# Patient Record
Sex: Female | Born: 1990
Health system: Southern US, Community
[De-identification: ages and names within clinical notes are randomized; demographics above are authoritative.]

## PROBLEM LIST (undated history)

## (undated) ENCOUNTER — Inpatient Hospital Stay (HOSPITAL_COMMUNITY): Payer: Self-pay

## (undated) ENCOUNTER — Emergency Department (HOSPITAL_COMMUNITY): Discharge status: Against Medical Advice | Payer: Self-pay

## (undated) DIAGNOSIS — O009 Unspecified ectopic pregnancy without intrauterine pregnancy: Secondary | ICD-10-CM

## (undated) DIAGNOSIS — N200 Calculus of kidney: Secondary | ICD-10-CM

## (undated) DIAGNOSIS — R519 Headache, unspecified: Secondary | ICD-10-CM

## (undated) DIAGNOSIS — D649 Anemia, unspecified: Secondary | ICD-10-CM

## (undated) DIAGNOSIS — E039 Hypothyroidism, unspecified: Secondary | ICD-10-CM

## (undated) DIAGNOSIS — R51 Headache: Secondary | ICD-10-CM

## (undated) DIAGNOSIS — N83209 Unspecified ovarian cyst, unspecified side: Secondary | ICD-10-CM

## (undated) DIAGNOSIS — B999 Unspecified infectious disease: Secondary | ICD-10-CM

## (undated) DIAGNOSIS — O9A213 Injury, poisoning and certain other consequences of external causes complicating pregnancy, third trimester: Secondary | ICD-10-CM

## (undated) HISTORY — DX: Headache: R51

## (undated) HISTORY — DX: Headache, unspecified: R51.9

## (undated) HISTORY — PX: MOUTH SURGERY: SHX715

---
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
## 2010-10-07 LAB — COMPREHENSIVE METABOLIC PANEL
Albumin: 4.3 g/dL (ref 3.5–5.2)
Alkaline Phosphatase: 85 U/L (ref 39–117)
BUN: 13 mg/dL (ref 6–23)
CO2: 27 mEq/L (ref 19–32)
Calcium: 9.5 mg/dL (ref 8.4–10.5)
Chloride: 107 mEq/L (ref 96–112)
GFR calc Af Amer: >60 mL/min (ref >60)
Glucose, Bld: 131 mg/dL — ABNORMAL HIGH (ref 70–99)
Potassium: 3.2 mEq/L — ABNORMAL LOW (ref 3.5–5.1)
Total Bilirubin: 0.6 mg/dL (ref 0.3–1.2)

## 2010-10-07 LAB — URINALYSIS, ROUTINE W REFLEX MICROSCOPIC: Protein, ur: 100 mg/dL — AB

## 2010-10-07 LAB — POCT I-STAT, CHEM 8
Calcium, Ion: 1.21 mmol/L (ref 1.12–1.32)
Glucose, Bld: 124 mg/dL — ABNORMAL HIGH (ref 70–99)
Sodium: 142 mEq/L (ref 135–145)
TCO2: 25 mmol/L (ref 0–100)

## 2010-10-07 LAB — CBC
HCT: 35 % — ABNORMAL LOW (ref 36.0–46.0)
MCV: 96.4 fL (ref 78.0–100.0)
RBC: 3.62 MIL/uL — ABNORMAL LOW (ref 3.87–5.11)
RDW: 12.1 % (ref 11.5–15.5)
WBC: 6.4 10*3/uL (ref 4.0–10.5)

## 2010-10-07 LAB — WET PREP, GENITAL: Yeast Wet Prep HPF POC: NONE SEEN

## 2010-10-07 LAB — URINE MICROSCOPIC-ADD ON

## 2010-10-07 LAB — GC/CHLAMYDIA PROBE AMP, GENITAL
Chlamydia, DNA Probe: NEGATIVE
GC Probe Amp, Genital: NEGATIVE

## 2010-10-07 LAB — DIFFERENTIAL
Basophils Absolute: 0.1 10*3/uL (ref 0.0–0.1)
Eosinophils Relative: 1 % (ref 0–5)
Lymphocytes Relative: 25 % (ref 12–46)
Lymphs Abs: 1.6 10*3/uL (ref 0.7–4.0)
Neutro Abs: 4.4 10*3/uL (ref 1.7–7.7)

## 2013-06-24 ENCOUNTER — Encounter (HOSPITAL_COMMUNITY): Payer: Self-pay | Admitting: Emergency Medicine

## 2013-06-24 ENCOUNTER — Emergency Department (HOSPITAL_COMMUNITY)
Admission: EM | Admit: 2013-06-24 | Discharge: 2013-06-24 | Discharge status: Against Medical Advice | Payer: Medicaid Other | Attending: Emergency Medicine | Admitting: Emergency Medicine

## 2013-06-24 DIAGNOSIS — R109 Unspecified abdominal pain: Secondary | ICD-10-CM | POA: Insufficient documentation

## 2013-06-24 HISTORY — DX: Calculus of kidney: N20.0

## 2013-06-24 NOTE — ED Notes (Signed)
Pt left prior to end of triage, states per family, her insurance does not cover ED visit to [facility]. Pt accompanied by family(mother), she states she is going to [facility]. Her Vital signs at this moment, are temp of 97.6, RR16, pulse of 65, B/P 99/64 O2 Sat 100%. Pain assessed at 5. Pt is exhibits no signs of distress. She complains of nausea, offered her an emesis container. Asked if pt needs anything prior to departure. Accompanied pt to the exit lobby.

## 2015-02-23 ENCOUNTER — Emergency Department (HOSPITAL_COMMUNITY): Payer: 59

## 2015-02-23 ENCOUNTER — Encounter (HOSPITAL_COMMUNITY): Payer: Self-pay

## 2015-02-23 ENCOUNTER — Emergency Department (HOSPITAL_COMMUNITY)
Admission: EM | Admit: 2015-02-23 | Discharge: 2015-02-23 | Discharge status: Against Medical Advice | Payer: 59 | Attending: Emergency Medicine | Admitting: Emergency Medicine

## 2015-02-23 DIAGNOSIS — R4789 Other speech disturbances: Secondary | ICD-10-CM

## 2015-02-23 DIAGNOSIS — Z3A16 16 weeks gestation of pregnancy: Secondary | ICD-10-CM | POA: Diagnosis not present

## 2015-02-23 DIAGNOSIS — O99341 Other mental disorders complicating pregnancy, first trimester: Secondary | ICD-10-CM | POA: Diagnosis not present

## 2015-02-23 DIAGNOSIS — Z87442 Personal history of urinary calculi: Secondary | ICD-10-CM | POA: Diagnosis not present

## 2015-02-23 DIAGNOSIS — F8089 Other developmental disorders of speech and language: Secondary | ICD-10-CM | POA: Diagnosis not present

## 2015-02-23 LAB — COMPREHENSIVE METABOLIC PANEL
ALK PHOS: 59 U/L (ref 38–126)
ALT: 16 U/L (ref 14–54)
AST: 22 U/L (ref 15–41)
Albumin: 3.7 g/dL (ref 3.5–5.0)
Anion gap: 7 (ref 5–15)
BILIRUBIN TOTAL: 0.6 mg/dL (ref 0.3–1.2)
BUN: 6 mg/dL (ref 6–20)
CALCIUM: 9.1 mg/dL (ref 8.9–10.3)
CO2: 23 mmol/L (ref 22–32)
CREATININE: 0.46 mg/dL (ref 0.44–1.00)
Chloride: 104 mmol/L (ref 101–111)
Glucose, Bld: 79 mg/dL (ref 65–99)
Potassium: 3.4 mmol/L — ABNORMAL LOW (ref 3.5–5.1)
Sodium: 134 mmol/L — ABNORMAL LOW (ref 135–145)
Total Protein: 6.8 g/dL (ref 6.5–8.1)

## 2015-02-23 LAB — URINALYSIS, ROUTINE W REFLEX MICROSCOPIC
Bilirubin Urine: NEGATIVE
GLUCOSE, UA: NEGATIVE mg/dL
HGB URINE DIPSTICK: NEGATIVE
Ketones, ur: NEGATIVE mg/dL
Leukocytes, UA: NEGATIVE
Nitrite: NEGATIVE
Protein, ur: NEGATIVE mg/dL
SPECIFIC GRAVITY, URINE: 1.021 (ref 1.005–1.030)
Urobilinogen, UA: 0.2 mg/dL (ref 0.0–1.0)
pH: 5.5 (ref 5.0–8.0)

## 2015-02-23 LAB — DIFFERENTIAL
BASOS PCT: 0 % (ref 0–1)
Basophils Absolute: 0 10*3/uL (ref 0.0–0.1)
Eosinophils Absolute: 0.1 10*3/uL (ref 0.0–0.7)
Eosinophils Relative: 1 % (ref 0–5)
LYMPHS ABS: 1.6 10*3/uL (ref 0.7–4.0)
LYMPHS PCT: 22 % (ref 12–46)
MONO ABS: 0.4 10*3/uL (ref 0.1–1.0)
MONOS PCT: 6 % (ref 3–12)
NEUTROS ABS: 5.1 10*3/uL (ref 1.7–7.7)
Neutrophils Relative %: 71 % (ref 43–77)

## 2015-02-23 LAB — I-STAT CHEM 8, ED
BUN: 7 mg/dL (ref 6–20)
CREATININE: 0.5 mg/dL (ref 0.44–1.00)
Calcium, Ion: 1.25 mmol/L — ABNORMAL HIGH (ref 1.12–1.23)
Chloride: 103 mmol/L (ref 101–111)
GLUCOSE: 77 mg/dL (ref 65–99)
HCT: 33 % — ABNORMAL LOW (ref 36.0–46.0)
HEMOGLOBIN: 11.2 g/dL — AB (ref 12.0–15.0)
POTASSIUM: 3.5 mmol/L (ref 3.5–5.1)
Sodium: 137 mmol/L (ref 135–145)
TCO2: 20 mmol/L (ref 0–100)

## 2015-02-23 LAB — RAPID URINE DRUG SCREEN, HOSP PERFORMED
AMPHETAMINES: NOT DETECTED
BENZODIAZEPINES: NOT DETECTED
Barbiturates: NOT DETECTED
Cocaine: NOT DETECTED
OPIATES: NOT DETECTED
Tetrahydrocannabinol: NOT DETECTED

## 2015-02-23 LAB — CBC
HEMATOCRIT: 31.5 % — AB (ref 36.0–46.0)
Hemoglobin: 10.9 g/dL — ABNORMAL LOW (ref 12.0–15.0)
MCH: 31.3 pg (ref 26.0–34.0)
MCHC: 34.6 g/dL (ref 30.0–36.0)
MCV: 90.5 fL (ref 78.0–100.0)
Platelets: 258 10*3/uL (ref 150–400)
RBC: 3.48 MIL/uL — ABNORMAL LOW (ref 3.87–5.11)
RDW: 12.9 % (ref 11.5–15.5)
WBC: 7.2 10*3/uL (ref 4.0–10.5)

## 2015-02-23 LAB — I-STAT TROPONIN, ED: TROPONIN I, POC: 0 ng/mL (ref 0.00–0.08)

## 2015-02-23 LAB — PROTIME-INR
INR: 1 (ref 0.00–1.49)
Prothrombin Time: 13.4 seconds (ref 11.6–15.2)

## 2015-02-23 LAB — APTT: aPTT: 28 seconds (ref 24–37)

## 2015-02-23 NOTE — ED Provider Notes (Addendum)
MSE was initiated and I personally evaluated the patient and placed orders (if any) at  2:54 PM on February 23, 2015.  The patient appears stable so that the remainder of the MSE may be completed by another provider.   I was called out to triage to evaluate this patient. Patient complaining of difficulty speaking and memory issues that started about 45 minutes ago while she was in a store. She was with her relative who had brought her great and she did not know what this food was at the time when she typically would know what a grape is. She is also having trouble remembering or placing family members that the family member was naming off. She felt confused at this time. This is progressively improved while she doesn't quite feel normal her speech is now normal. Significant other at the bedside states that her speech is slightly slow but otherwise not slurred and she is naming things correctly now. Denies any headache. Is about [redacted] weeks pregnant and has had intermittent bleeding throughout the pregnancy. No due to this she is worried about bleeding in her brain. She has no headache. On my quick neurologic exam her cranial nerves are intact, 5/5 strength in all 4 extremities, and normal speech. She is able to name a pen, wristwatch, and her significant other. I'm finding no acute deficits that would be consistent with a stroke. She may need workup but at this time is not a code stroke. Her BP is normal at this time. I spoke to Dr. Thad Ranger who notes that due to being pregnant she should probably get and MRI and MRV. The provider that sees her will need to discuss this with patient and about any possible risks MRI could pose to baby per neuro. She agrees to MRI. Will place, she will need formal full evaluation by whoever assumes her care when she gets into a room.  Pricilla Loveless, MD 02/23/15 [phone]

## 2015-02-23 NOTE — ED Provider Notes (Signed)
CSN: [id number]     Arrival date & time 02/23/15  1440 History   First MD Initiated Contact with Patient 02/23/15 1621     Chief Complaint  Patient presents with  . Neurologic Problem     (Consider location/radiation/quality/duration/timing/severity/associated sxs/prior Treatment) HPI Comments: Patient is a healthy 24 year old female who is currently [redacted] weeks pregnant. She takes no medications except for prenatal vitamin. There have been complications with this pregnancy where she had partial placenta detachment with a large amount of bleeding however in the last week or 2 she's had a repeat ultrasound and everything is now normal in fetus is measuring normal dates. Today she was at [facility] with her relative after they have gone to church and they are walking around eating samples when all of a sudden she was unable to identify what a grape was.  At first sister thought that she was kidding but then she continued to start saying she didn't know what things were she did not understand what was going on she was unable to identify a close family members or even know who her sister was talking about. During all this she did know who her husband and sister were. However they state she would read something very coherently but just kept saying I don't know what this means and I don't know what I am reading. This lasted from 2 until 3:30 and then gradually improved. It is now completely resolved. Patient has no further complaints. This is never happened before. She is not taking any hormone supplements she denies headache, fever, chest pain, shortness of breath.  Patient is a 24 y.o. female presenting with neurologic complaint. The history is provided by the patient, the spouse and a relative.  Neurologic Problem This is a new problem. Episode onset: 5 hours ago. The problem occurs constantly. The problem has been resolved. Associated symptoms comments: Confusion and unable to understand what she was reading and  could not recognize family or common objects. Nothing aggravates the symptoms. Nothing relieves the symptoms. She has tried nothing for the symptoms. The treatment provided significant relief.    Past Medical History  Diagnosis Date  . Kidney calculi    History reviewed. No pertinent past surgical history. History reviewed. No pertinent family history. Social History  Substance Use Topics  . Smoking status: Never Smoker   . Smokeless tobacco: None  . Alcohol Use: No   OB History    Gravida Para Term Preterm AB TAB SAB Ectopic Multiple Living   1              Review of Systems  All other systems reviewed and are negative.     Allergies  Review of patient's allergies indicates no known allergies.  Home Medications   Prior to Admission medications   Not on File   BP 118/76 mmHg  Pulse 78  Temp(Src) 98.3 F (36.8 C) (Oral)  Resp 16  Ht  (1.575 m)  Wt 119 lb 14.4 oz (54.386 kg)  BMI 21.92 kg/m2  SpO2 99% Physical Exam  Constitutional: She is oriented to person, place, and time. She appears well-developed and well-nourished. No distress.  HENT:  Head: Normocephalic and atraumatic.  Mouth/Throat: Oropharynx is clear and moist.  Eyes: Conjunctivae and EOM are normal. Pupils are equal, round, and reactive to light.  Neck: Normal range of motion. Neck supple.  Cardiovascular: Normal rate, regular rhythm and intact distal pulses.   No murmur heard. Pulmonary/Chest: Effort normal and breath sounds normal.  No respiratory distress. She has no wheezes. She has no rales.  Abdominal: Soft. She exhibits no distension. There is no tenderness. There is no rebound and no guarding.  Musculoskeletal: Normal range of motion. She exhibits no edema or tenderness.  Neurological: She is alert and oriented to person, place, and time. She has normal strength. No cranial nerve deficit or sensory deficit. Coordination and gait normal.  Normal speech and recall  Skin: Skin is warm and  dry. No rash noted. No erythema.  Psychiatric: She has a normal mood and affect. Her behavior is normal.  Nursing note and vitals reviewed.   ED Course  Procedures (including critical care time) Labs Review Labs Reviewed  CBC - Abnormal; Notable for the following:    RBC 3.48 (*)    Hemoglobin 10.9 (*)    HCT 31.5 (*)    All other components within normal limits  COMPREHENSIVE METABOLIC PANEL - Abnormal; Notable for the following:    Sodium 134 (*)    Potassium 3.4 (*)    All other components within normal limits  URINALYSIS, ROUTINE W REFLEX MICROSCOPIC (NOT AT [facility]) - Abnormal; Notable for the following:    APPearance CLOUDY (*)    All other components within normal limits  I-STAT CHEM 8, ED - Abnormal; Notable for the following:    Calcium, Ion 1.25 (*)    Hemoglobin 11.2 (*)    HCT 33.0 (*)    All other components within normal limits  PROTIME-INR  APTT  DIFFERENTIAL  URINE RAPID DRUG SCREEN, HOSP PERFORMED  ETHANOL  Rosezena Sensor, ED    Imaging Review Mr Brain Wo Contrast  02/23/2015   CLINICAL DATA:  Pregnant. Transient speech abnormality. Transient memory problem  EXAM: MRI HEAD WITHOUT CONTRAST  MRV HEAD WITHOUT CONTRAST  TECHNIQUE: Multiplanar, multiecho pulse sequences of the brain and surrounding structures were obtained without intravenous contrast. Angiographic images of the intracranial venous structures were obtained using MRV technique without intravenous contrast.  COMPARISON:  None.  FINDINGS: Negative for acute infarct.  Negative for chronic ischemic change  Negative for demyelinating disease. Cerebral white matter normal. Brainstem and cerebellum normal.  Ventricle size normal.  Cerebral volume normal.  Pituitary normal in size.  Cervical medullary junction normal.  Negative for hemorrhage or fluid collection. Negative for mass or edema.  Paranasal sinuses clear.  Normal orbit.  Superior sagittal sinus widely patent. Right transverse sinus dominant.  Transverse sinus and sigmoid sinus are patent bilaterally without thrombosis. Internal cerebral veins and straight sinus patent bilaterally.  IMPRESSION: Normal MRI of the brain  Normal MRV of the brain.   Electronically Signed   By: Marlan Palau M.D.   On: 02/23/2015 17:32   Mr Mrv Head Wo Cm  02/23/2015   CLINICAL DATA:  Pregnant. Transient speech abnormality. Transient memory problem  EXAM: MRI HEAD WITHOUT CONTRAST  MRV HEAD WITHOUT CONTRAST  TECHNIQUE: Multiplanar, multiecho pulse sequences of the brain and surrounding structures were obtained without intravenous contrast. Angiographic images of the intracranial venous structures were obtained using MRV technique without intravenous contrast.  COMPARISON:  None.  FINDINGS: Negative for acute infarct.  Negative for chronic ischemic change  Negative for demyelinating disease. Cerebral white matter normal. Brainstem and cerebellum normal.  Ventricle size normal.  Cerebral volume normal.  Pituitary normal in size.  Cervical medullary junction normal.  Negative for hemorrhage or fluid collection. Negative for mass or edema.  Paranasal sinuses clear.  Normal orbit.  Superior sagittal sinus widely patent. Right  transverse sinus dominant. Transverse sinus and sigmoid sinus are patent bilaterally without thrombosis. Internal cerebral veins and straight sinus patent bilaterally.  IMPRESSION: Normal MRI of the brain  Normal MRV of the brain.   Electronically Signed   By: Marlan Palau M.D.   On: 02/23/2015 17:32   I have personally reviewed and evaluated these images and lab results as part of my medical decision-making.   EKG Interpretation   Date/Time:  Sunday February 23 2015 15:15:56 EDT Ventricular Rate:  78 PR Interval:  144 QRS Duration: 72 QT Interval:  366 QTC Calculation: 417 R Axis:   81 Text Interpretation:  Normal sinus rhythm Normal ECG No old tracing to  compare Confirmed by GOLDSTON  MD, SCOTT ([id number]) on 02/23/2015 3:28:18 PM      MDM    Final diagnoses:  Word finding difficulty    Patient coming in with word finding difficulty that started approximately 2:00 today of resolved by 4. She has a history of being [redacted] weeks pregnant with complications in the pregnancy early on that now resolved. Currently she is only taking a prenatal vitamin and husband denies any prior exam general this estrogen use or fertility medications. Sister denied her complaining of a headache but states right before the episode she did feel slightly lightheaded. Husband states that she has had issues with hypoglycemia during this pregnancy if she does not eat and she had only had half an English muffin today. However all of her symptoms resolved without eating.  Dr. Gwenlyn Fudge saw the patient in triage and spoke with Dr. Thad Ranger who recommended that she get an MRI and MR be because she was pregnant to rule out owing pathology. Patient is currently in radiology when I went to see her.  5:47 PM All imaging and labs are within normal limits. Discussed with neurology and feel pt is safe for d/c.  Could've been that this was a brief hypoglycemic event she did not eat and felt dizzy prior to the episode versus some other unexplained event. Patient come follow-up with her OB/GYN. This was discussed with the patient and her family.  Gwyneth Sprout, MD 02/23/15 1840

## 2015-02-23 NOTE — ED Notes (Signed)
Patient returned from MRI.

## 2015-02-23 NOTE — ED Notes (Signed)
[redacted] weeks pregnant.  G2P0,,, complications with this pregnancy, partially detached placenta @ 9 weeks with large amount of bleeding initially, mild to spotting until 14 weeks.  LOV 02-20-15, placenta completely attached, u/s done- normal.  No bleeding since stopped at 14 weeks.  Onset today pt was with sister @ [employer], pt had brief episode of not being able to process things the the names of foods, relatives.  Pt is responding a little slower than usual to questions per husband, pt reports she has to think about what she is saying.  No facial drooping, weakness or sensation deficits.  Dr.Goldston at triage assessing pt.

## 2015-02-23 NOTE — Discharge Instructions (Signed)
Aphasia Aphasia is a neurological disorder caused by damage to the parts of the brain that control language. CAUSES  Aphasia is not a disease, but a symptom of brain damage. Aphasia is commonly seen in adults who have suffered a stroke. Aphasia also can result from:  A brain tumor.  Infection.  Head injury.  A rare type of dementia called Primary Progressive Aphasia. Common types of dementia may be associated with aphasia but can also exist without language problems. SYMPTOMS  Primary signs of the disorder include:  Problems expressing oneself when speaking.  Trouble understanding speech.  Difficulty with reading and writing.  Speaking in short or incomplete sentences.  Speaking in sentences that don't make sense.  Speaking unrecognizable words.  Interpreting figurative language literally.  Writing sentences that don't make sense. The type and severity of language problems depend on the precise location and extent of the damaged brain tissue. Aphasia can be divided into four broad categories:  Expressive aphasia - difficulty in conveying thoughts through speech or writing. The patient knows what they want to say, but cannot find the words they need.  Receptive aphasia - difficulty understanding spoken or written language. The patient hears the voice or sees the print but cannot make sense of the words.  Anomic or amnesia aphasia - difficulty in using the correct names for particular objects, people, places, or events. This is the least severe form of aphasia.  Global aphasia results from severe and extensive damage to the language areas of the brain. Patients lose almost all language function, both comprehension (understanding) and expression. They cannot speak, understand speech, read, or write. TREATMENT  Sometimes an individual will completely recover from aphasia without treatment. In most cases, language therapy should begin as soon as possible. Language therapy should  be tailored to the individual needs of the patient. Therapy with a speech pathologist involves exercises in which patients:  Read.  Write.  Follow directions.  Repeat what they hear.  Computer-aided therapy may also be used. PROGNOSIS  The outcome of aphasia is difficult to predict. People who are younger or have less extensive brain damage do better. The location of the injury is also important. The location is a clue to prognosis. In general, patients tend to recover skills in language comprehension (understanding) more completely than those skills involving expression (speaking or writing). Document Released: 03/13/2002 Document Revised: 09/13/2011 Document Reviewed: 09/10/2013 [employer] Patient Information 2015 [employer]. This information is not intended to replace advice given to you by your health care provider. Make sure you discuss any questions you have with your health care provider.  

## 2015-05-28 ENCOUNTER — Encounter (HOSPITAL_COMMUNITY): Payer: Self-pay

## 2015-05-28 ENCOUNTER — Inpatient Hospital Stay (HOSPITAL_COMMUNITY): Payer: 59

## 2015-05-28 ENCOUNTER — Inpatient Hospital Stay (HOSPITAL_COMMUNITY)
Admission: AD | Admit: 2015-05-28 | Discharge: 2015-05-28 | Disposition: A | Discharge status: Home / Self Care | Payer: 59 | Source: Ambulatory Visit | Attending: Obstetrics and Gynecology | Admitting: Obstetrics and Gynecology

## 2015-05-28 DIAGNOSIS — O43123 Velamentous insertion of umbilical cord, third trimester: Secondary | ICD-10-CM | POA: Insufficient documentation

## 2015-05-28 DIAGNOSIS — O9A212 Injury, poisoning and certain other consequences of external causes complicating pregnancy, second trimester: Secondary | ICD-10-CM

## 2015-05-28 DIAGNOSIS — R1032 Left lower quadrant pain: Secondary | ICD-10-CM | POA: Diagnosis not present

## 2015-05-28 DIAGNOSIS — W010XXA Fall on same level from slipping, tripping and stumbling without subsequent striking against object, initial encounter: Secondary | ICD-10-CM | POA: Insufficient documentation

## 2015-05-28 DIAGNOSIS — Z3A29 29 weeks gestation of pregnancy: Secondary | ICD-10-CM

## 2015-05-28 DIAGNOSIS — O26893 Other specified pregnancy related conditions, third trimester: Secondary | ICD-10-CM | POA: Diagnosis not present

## 2015-05-28 DIAGNOSIS — O26899 Other specified pregnancy related conditions, unspecified trimester: Secondary | ICD-10-CM

## 2015-05-28 DIAGNOSIS — O9A213 Injury, poisoning and certain other consequences of external causes complicating pregnancy, third trimester: Secondary | ICD-10-CM

## 2015-05-28 DIAGNOSIS — R109 Unspecified abdominal pain: Secondary | ICD-10-CM

## 2015-05-28 LAB — KLEIHAUER-BETKE STAIN
# Vials RhIg: 1
FETAL CELLS %: 0 %
QUANTITATION FETAL HEMOGLOBIN: 0 mL

## 2015-05-28 MED ORDER — ACETAMINOPHEN 500 MG PO TABS: 1000.0000 mg | ORAL_TABLET | Freq: Once | ORAL | Status: DC

## 2015-05-28 NOTE — Discharge Instructions (Signed)
What Do I Need to Know About Injuries During Pregnancy? Trauma is the most common cause of injury and death in pregnant women. This can also result in significant harm or death of the baby. Your baby is protected in the womb (uterus) by a sac filled with fluid (amniotic sac). Your baby can be harmed if there is direct, high-impact trauma to your abdomen and pelvis. This type of trauma can result in tearing of your uterus, the placenta pulling away from the wall of the uterus (placenta abruption), or the amniotic sac breaking open (rupture of membranes). These injuries can decrease or stop the blood supply to your baby or cause you to go into labor earlier than expected. Minor falls and low-impact automobile accidents do not usually harm your baby, even if they do minimally harm you. WHAT KIND OF INJURIES CAN AFFECT MY PREGNANCY? The most common causes of injury or death to a baby include:  Falls. Falls are more common in the second and third trimester of the pregnancy. Factors that increase your risk of falling include:  Increase in your weight.  The change in your center of gravity.  Tripping over an object that cannot be seen.  Increased looseness (laxity) of your ligaments resulting in less coordinated movements (you may feel clumsy).  Falling during high-risk activities like horseback riding or skiing.  Automobile accidents. It is important to wear your seat belt properly, with the lap belt below your abdomen, and always practice safe driving.  Domestic violence or assault.  Burns (Metallurgistfire or electrical). The most common causes of injury or death to the pregnant woman include:  Injuries that cause severe bleeding, shock, and loss of blood flow to major organs.  Head and neck injuries that result in severe brain or spinal damage.  Chest trauma that can cause direct injury to the heart and lungs or any injury that affects the area enclosed by the ribs. Trauma to this area can result in  cardiorespiratory arrest. WHAT CAN I DO TO PROTECT MYSELF AND MY BABY FROM INJURY WHILE I AM PREGNANT?  Remove slippery rugs and loose objects on the floor that increase your risk of tripping.  Avoid walking on wet or slippery floors.  Wear comfortable shoes that have a good grip on the sole. Do not wear high-heeled shoes.  Always wear your seat belt properly, with the lap belt below your abdomen, and always practice safe driving. Do not ride on a motorcycle while pregnant.  Do not participate in high-impact activities or sports.  Avoid fires, starting fires, lifting heavy pots of boiling or hot liquids, and fixing electrical problems.  Only take over-the-counter or prescription medicines for pain, fever, or discomfort as directed by your health care provider.  Know your blood type and the father's blood type in case you develop vaginal bleeding or experience an injury for which a blood transfusion may be necessary.  Call your local emergency services (911 in the [place] if you are a victim of domestic violence or assault. Spousal abuse can be a significant cause of trauma during pregnancy. For help and support, contact the [employer] Domestic Violence Hotline. WHEN SHOULD I SEEK IMMEDIATE MEDICAL CARE?   You fall on your abdomen or experience any high-force accident or injury.  You have been assaulted (domestic or otherwise).  You have been in a car accident.  You develop vaginal bleeding.  You develop fluid leaking from the vagina.  You develop uterine contractions (pelvic cramping, pain, or significant low back  pain).  You become weak or faint, or have uncontrolled vomiting after trauma.  You had a serious burn. This includes burns to the face, neck, hands, or genitals, or burns greater than the size of your palm anywhere else.  You develop neck stiffness or pain after a fall or from other trauma.  You develop a headache or vision problems after a fall or from other  trauma.  You do not feel the baby moving or the baby is not moving as much as before a fall or other trauma.   This information is not intended to replace advice given to you by your health care provider. Make sure you discuss any questions you have with your health care provider.   Document Released: 07/29/2004 Document Revised: 07/12/2014 Document Reviewed: 03/28/2013 Elsevier Interactive Patient Education 2016 [employer]. Placental Abruption for INFORMATION ONLY Your placenta is the organ that nourishes your unborn baby (fetus). Your baby gets his or her blood supply and nutrients through your placenta. It is your baby's life support system. It is attached to the inside of your uterus until after your baby is born.  Placental abruption is when the placenta partly or completely separates from the uterus before your baby is born. This is rare, but it can happen any time after 20 weeks of pregnancy. A small separation may not cause problems, but a large separation may be dangerous for you and your baby. CAUSES  Most of the time the cause of a placental abruption is unknown. Though it is rare, a placental abruption can be caused by:   An abdominal injury.   The baby turning from a buttocks-first position (breech presentation) or a sideways position (transverse) to a headfirst position (cephalic).   Delivering the first of multiple babies (twins, triplets, or more).   Sudden loss of amniotic fluid (premature rupture of the membranes).   An abnormally short umbilical cord. RISK FACTORS Some risk factors make a placental abruption more likely, including:  History of placental abruption.  High blood pressure (hypertension).  Smoking.  Alcohol intake.  Blood clotting problems.  Too much amniotic fluid.  Having had multiples (twins or triplets or more).  Seizures and convulsions.  Diabetes mellitus.  Having had more than four children.  Age 36 years or older.  Illegal  drug use.  Injury to your abdomen. SIGNS AND SYMPTOMS  A small placental abruption may not cause symptoms. If you do have symptoms, they may include:  Mild abdominal pain.  Slight vaginal bleeding. Symptoms of severe placental abruption depend on the size of the separation and the stage of pregnancy. Symptoms may include:   Sudden pain in your uterus.  Abdominal pain.  Vaginal bleeding.  Tender uterus.  Severe abdominal pain with tenderness.  Continual contractions of your uterus.  Back pain.  Weakness, light-headedness. DIAGNOSIS  Placental abruption is suspected when a pregnant woman develops sudden pain in her uterus. The health care provider will check whether the uterus is very tender, hard, and enlarging and whether the baby has an abnormal heart rate or rhythm. Ultrasonography (commonly called an ultrasound) will be done. Blood work will also be done to make sure that there are enough healthy red blood cells and that there are no clotting problems or signs of too much blood loss. TREATMENT  Placental abruption is usually an emergency. It requires treatment right away. Your treatment will depend on:   The amount of bleeding.  Whether you or you baby are in distress.  The  stage of your pregnancy.  The maturity of the baby. Treatment for partial separation of the placenta is bed rest and close observation. You also may need a blood transfusion or to receive fluids through an IV tube. Treatment for complete placental separation is delivery of your baby. You may have a cesarean delivery if your baby is in distress. HOME CARE INSTRUCTIONS   Only take medicines as directed by your health care provider.  Arrange for help at home before and after you deliver the baby, especially if you had a cesarean delivery or lost a lot of blood.  Get plenty of rest and sleep.  Do not have sexual intercourse until your health care provider says it is okay.  Do not use tampons or  douche unless your health care provider says it is okay. SEEK MEDICAL CARE IF:  You have light vaginal bleeding or spotting.  You have any type of trauma, such as a fall or jolt during an accident.  You are having trouble avoiding drugs, alcohol, or smoking. SEEK IMMEDIATE MEDICAL CARE IF:  You have vaginal bleeding.  You have abdominal pain.  You have continuous uterine contractions.  You have a hard, tender uterus.  You do not feel the baby move, or the baby moves very little. MAKE SURE YOU:  Understand these instructions.  Will watch your condition.  Will get help right away if you are not doing well or get worse.   This information is not intended to replace advice given to you by your health care provider. Make sure you discuss any questions you have with your health care provider.   Document Released: 06/21/2005 Document Revised: 06/26/2013 Document Reviewed: 04/13/2013 Elsevier Interactive Patient Education 2016 [employer]. Preterm Labor Information INFORMATION ONLY Preterm labor is when labor starts at less than 37 weeks of pregnancy. The normal length of a pregnancy is 39 to 41 weeks. CAUSES Often, there is no identifiable underlying cause as to why a woman goes into preterm labor. One of the most common known causes of preterm labor is infection. Infections of the uterus, cervix, vagina, amniotic sac, bladder, kidney, or even the lungs (pneumonia) can cause labor to start. Other suspected causes of preterm labor include:   Urogenital infections, such as yeast infections and bacterial vaginosis.   Uterine abnormalities (uterine shape, uterine septum, fibroids, or bleeding from the placenta).   A cervix that has been operated on (it may fail to stay closed).   Malformations in the fetus.   Multiple gestations (twins, triplets, and so on).   Breakage of the amniotic sac.  RISK FACTORS  Having a previous history of preterm labor.   Having premature  rupture of membranes (PROM).   Having a placenta that covers the opening of the cervix (placenta previa).   Having a placenta that separates from the uterus (placental abruption).   Having a cervix that is too weak to hold the fetus in the uterus (incompetent cervix).   Having too much fluid in the amniotic sac (polyhydramnios).   Taking illegal drugs or smoking while pregnant.   Not gaining enough weight while pregnant.   Being younger than 62 and older than 24 years old.   Having a low socioeconomic status.   Being African American. SYMPTOMS Signs and symptoms of preterm labor include:   Menstrual-like cramps, abdominal pain, or back pain.  Uterine contractions that are regular, as frequent as six in an hour, regardless of their intensity (may be mild or painful).  Contractions  that start on the top of the uterus and spread down to the lower abdomen and back.   A sense of increased pelvic pressure.   A watery or bloody mucus discharge that comes from the vagina.  TREATMENT Depending on the length of the pregnancy and other circumstances, your health care provider may suggest bed rest. If necessary, there are medicines that can be given to stop contractions and to mature the fetal lungs. If labor happens before 34 weeks of pregnancy, a prolonged hospital stay may be recommended. Treatment depends on the condition of both you and the fetus.  WHAT SHOULD YOU DO IF YOU THINK YOU ARE IN PRETERM LABOR? Call your health care provider right away. You will need to go to the hospital to get checked immediately. HOW CAN YOU PREVENT PRETERM LABOR IN FUTURE PREGNANCIES? You should:   Stop smoking if you smoke.  Maintain healthy weight gain and avoid chemicals and drugs that are not necessary.  Be watchful for any type of infection.  Inform your health care provider if you have a known history of preterm labor.   This information is not intended to replace advice given  to you by your health care provider. Make sure you discuss any questions you have with your health care provider.   Document Released: 09/11/2003 Document Revised: 02/21/2013 Document Reviewed: 07/24/2012 Elsevier Interactive Patient Education [employer]2016 Elsevier Inc.

## 2015-05-28 NOTE — MAU Provider Note (Signed)
History     CSN: [id number]  Arrival date and time: 05/28/15 1530 Provider notified: 1600 Orders placed in EPIC: 1627 Provider on unit: 1730 Provider at bedside: 1735     Chief Complaint  Patient presents with  . Fall   HPI Comments: [name] is [age] yo G2P0010 at 29.5 wks by LMP, presenting for s/p fall.  She states she was walking in the parking lot outside of her job and slipped (d/t poor tread on her shoes) hitting her LT hip.  She denies hitting her abdomen or her head during her fall. She reports having a little LLQ pain just after the fall, but now the pain is only concentrated in her LT groin area and LT hip.  She denies any bleeding or leaking of fluid. (+) FM. Her blood type is: A+ (per prenatal records from previous practice).  Her prenatal care has been complicated by: marginal cord insertion.  Her primary OB provider at [facility] is Alison Turner, CNM.  Fall The accident occurred 6 to 12 hours ago. The fall occurred while walking. She fell from an unknown height. She landed on concrete. There was no blood loss. The point of impact was the left hip. The pain is present in the left hip. The pain is mild. The symptoms are aggravated by ambulation. Associated symptoms include abdominal pain. Associated symptoms comments: LLQ pain. She has tried nothing for the symptoms. The treatment provided no relief.      Past Medical History  Diagnosis Date  . Kidney calculi     History reviewed. No pertinent past surgical history.  History reviewed. No pertinent family history.  Social History  Substance Use Topics  . Smoking status: Never Smoker   . Smokeless tobacco: None  . Alcohol Use: No    Allergies: No Known Allergies  Prescriptions prior to admission  Medication Sig Dispense Refill Last Dose  . Prenatal Vit-Fe Fumarate-FA (PRENATAL MULTIVITAMIN) TABS tablet Take 1 tablet by mouth daily at 12 noon.   Past Month at Unknown time    Review of Systems   Constitutional: Negative.   HENT: Negative.   Eyes: Negative.   Cardiovascular: Negative.   Gastrointestinal: Positive for abdominal pain.  Genitourinary: Negative.   Musculoskeletal: Positive for joint pain.       LT groin & hip soreness  Skin: Negative.   Neurological: Negative.   Endo/Heme/Allergies: Negative.   Psychiatric/Behavioral: Negative.    CEFM  FHR: 130 bpm / moderate variability / accels present / decels absent TOCO: No UC's, occ UI in earlier tracing  OB Limited U/S Posterior placenta with no abruption seen, AFI WNL, cephalic presentation, CL 3.45 cm  Physical Exam   Blood pressure 99/60, pulse 93, temperature 98.7 F (37.1 C), temperature source Oral, resp. rate 18, height 5\' 3"  (1.6 m), weight 62.415 kg (137 lb 9.6 oz).  Physical Exam  Constitutional: She is oriented to person, place, and time. She appears well-developed and well-nourished.  HENT:  Head: Normocephalic and atraumatic.  Eyes: Pupils are equal, round, and reactive to light.  Neck: Normal range of motion.  Cardiovascular: Normal rate, regular rhythm, normal heart sounds and intact distal pulses.   Respiratory: Effort normal and breath sounds normal.  GI: Soft. Bowel sounds are normal.  Genitourinary:  Pelvic deferred  Musculoskeletal: Normal range of motion.  Neurological: She is alert and oriented to person, place, and time. She has normal reflexes.  Skin: Skin is warm and dry.  Psychiatric: She has a normal  mood and affect. Her behavior is normal. Judgment and thought content normal.    MAU Course  Procedures Prolonged CEFM x 4 hrs OB Limited U/S KLB Tylenol 1000 mg x 1 dose prn pain Assessment and Plan  G2P0010 with 29.[redacted] wks gestation SIUP S/P Traumatic Injury during pregnancy, third trimester Marginal Cord Insertion Category 1 FHR tracing  Discharge Home after 4hrs of monitoring Bleeding and PTL precautions discussed / things to watch for UC's every 5 mins and/or stronger,  leaking or gushing of vaginal fluid, BRB like a period, decreased or no FM Keep scheduled appointment with [facility] Call the office for any of the above s/s or worsening pain  *Dr. Billy Coast notified of assessment and plan - agrees  Kenard Gower MSN, CNM 05/28/2015, 5:41 PM

## 2015-05-28 NOTE — MAU Note (Signed)
Pt reports she fell on her left side today. C/o feeling like she  strained something on that side. Good fetal movement reported no vag bleeding noted.

## 2015-05-28 NOTE — Progress Notes (Signed)
Returned call and put in orders for u/s and fetal monitoring. Will come see patient

## 2015-05-28 NOTE — Progress Notes (Signed)
Message left regarding pt in MAU and requesting call back. 

## 2015-06-02 ENCOUNTER — Inpatient Hospital Stay (HOSPITAL_COMMUNITY)
Admission: AD | Admit: 2015-06-02 | Discharge: 2015-06-02 | Disposition: A | Discharge status: Home / Self Care | Payer: 59 | Source: Ambulatory Visit | Attending: Obstetrics | Admitting: Obstetrics

## 2015-06-02 ENCOUNTER — Encounter (HOSPITAL_COMMUNITY): Payer: Self-pay

## 2015-06-02 DIAGNOSIS — Z3689 Encounter for other specified antenatal screening: Secondary | ICD-10-CM

## 2015-06-02 DIAGNOSIS — D509 Iron deficiency anemia, unspecified: Secondary | ICD-10-CM

## 2015-06-02 DIAGNOSIS — Z3A30 30 weeks gestation of pregnancy: Secondary | ICD-10-CM | POA: Insufficient documentation

## 2015-06-02 DIAGNOSIS — E860 Dehydration: Secondary | ICD-10-CM

## 2015-06-02 DIAGNOSIS — O99013 Anemia complicating pregnancy, third trimester: Secondary | ICD-10-CM

## 2015-06-02 DIAGNOSIS — O4703 False labor before 37 completed weeks of gestation, third trimester: Secondary | ICD-10-CM

## 2015-06-02 DIAGNOSIS — O26893 Other specified pregnancy related conditions, third trimester: Secondary | ICD-10-CM | POA: Diagnosis present

## 2015-06-02 LAB — URINALYSIS, ROUTINE W REFLEX MICROSCOPIC
Bilirubin Urine: NEGATIVE
Bilirubin Urine: NEGATIVE
GLUCOSE, UA: NEGATIVE mg/dL
Glucose, UA: NEGATIVE mg/dL
KETONES UR: 40 mg/dL — AB
Ketones, ur: 40 mg/dL — AB
LEUKOCYTES UA: NEGATIVE
NITRITE: NEGATIVE
Nitrite: NEGATIVE
PH: 6 (ref 5.0–8.0)
Protein, ur: NEGATIVE mg/dL
Protein, ur: NEGATIVE mg/dL
SPECIFIC GRAVITY, URINE: 1.025 (ref 1.005–1.030)
pH: 6 (ref 5.0–8.0)

## 2015-06-02 LAB — URINE MICROSCOPIC-ADD ON

## 2015-06-02 LAB — CBC
HEMATOCRIT: 24.7 % — AB (ref 36.0–46.0)
HEMOGLOBIN: 8.6 g/dL — AB (ref 12.0–15.0)
MCH: 32.5 pg (ref 26.0–34.0)
MCHC: 34.8 g/dL (ref 30.0–36.0)
MCV: 93.2 fL (ref 78.0–100.0)
Platelets: 153 10*3/uL (ref 150–400)
RBC: 2.65 MIL/uL — ABNORMAL LOW (ref 3.87–5.11)
RDW: 13.4 % (ref 11.5–15.5)
WBC: 9.3 10*3/uL (ref 4.0–10.5)

## 2015-06-02 MED ORDER — LACTATED RINGERS IV SOLN
INTRAVENOUS | Status: DC
  Administered 2015-06-02: 14:00:00 via INTRAVENOUS

## 2015-06-02 MED ORDER — BETAMETHASONE SOD PHOS & ACET 6 (3-3) MG/ML IJ SUSP
12.0000 mg | Freq: Once | INTRAMUSCULAR | Status: AC
  Administered 2015-06-02: 12 mg via INTRAMUSCULAR
  Filled 2015-06-02 (×2): qty 2

## 2015-06-02 MED ORDER — ACETAMINOPHEN 500 MG PO TABS
1000.0000 mg | ORAL_TABLET | Freq: Once | ORAL | Status: AC
  Administered 2015-06-02: 1000 mg via ORAL
  Filled 2015-06-02: qty 2

## 2015-06-02 MED ORDER — ONDANSETRON HCL 4 MG/2ML IJ SOLN
4.0000 mg | Freq: Once | INTRAMUSCULAR | Status: AC
  Administered 2015-06-02: 4 mg via INTRAVENOUS
  Filled 2015-06-02: qty 2

## 2015-06-02 MED ORDER — LACTATED RINGERS IV BOLUS (SEPSIS)
1000.0000 mL | Freq: Once | INTRAVENOUS | Status: AC
  Administered 2015-06-02: 1000 mL via INTRAVENOUS

## 2015-06-02 MED ORDER — NIFEDIPINE 10 MG PO CAPS
10.0000 mg | ORAL_CAPSULE | Freq: Once | ORAL | Status: AC
  Administered 2015-06-02: 10 mg via ORAL
  Filled 2015-06-02: qty 1

## 2015-06-02 NOTE — MAU Provider Note (Signed)
History     CSN: [id number]  Arrival date and time: 06/02/15 1153   None     Chief Complaint  Patient presents with  . Abdominal Pain  . Back Pain   HPI Comments: G2P0010 @30 .3 weeks sent from office for c/o ctx and back pain. Reports ctx started around 2100 last night, were intense then subsided during the night but then had persistent lower back pain mostly on right side and hip. She felt hot and cold during the night and temp was 100.0 this am. Denies dysuria and hematuria but reports polyuria this am. No VB or LOF. Good FM. Also reports nausea since 10 am and has not eaten since noon yesterday. Pregnancy complicated by hx SAB, marginal CI with normal growth and AF, and recent fall about 5 days ago w/o abd contact or trauma. Remote hx of recurrent UTIs and kidney stones.   Abdominal Pain Associated symptoms include a fever, frequency, headaches and nausea. Pertinent negatives include no diarrhea, dysuria, hematuria or vomiting.  Back Pain Associated symptoms include abdominal pain, a fever and headaches. Pertinent negatives include no dysuria.    OB History    Gravida Para Term Preterm AB TAB SAB Ectopic Multiple Living   2    1  1          Past Medical History  Diagnosis Date  . Kidney calculi     Past Surgical History  Procedure Laterality Date  . Mouth surgery      No family history on file.  Social History  Substance Use Topics  . Smoking status: Never Smoker   . Smokeless tobacco: None  . Alcohol Use: No    Allergies: No Known Allergies  Prescriptions prior to admission  Medication Sig Dispense Refill Last Dose  . Prenatal Vit-Fe Fumarate-FA (PRENATAL MULTIVITAMIN) TABS tablet Take 1 tablet by mouth daily at 12 noon.   Past Month at Unknown time    Review of Systems  Constitutional: Positive for fever, chills and malaise/fatigue.  Eyes: Negative.   Respiratory: Negative.   Gastrointestinal: Positive for nausea and abdominal pain. Negative for  vomiting and diarrhea.  Genitourinary: Positive for frequency and flank pain. Negative for dysuria, urgency and hematuria.  Musculoskeletal: Positive for back pain.  Skin: Negative.   Neurological: Positive for headaches.  Endo/Heme/Allergies: Negative.   Psychiatric/Behavioral: Negative.    Physical Exam   Blood pressure 102/62, pulse 92, temperature 98.5 F (36.9 C), temperature source Oral, resp. rate 18.  Physical Exam  Constitutional: She is oriented to person, place, and time. She appears well-developed and well-nourished.  HENT:  Head: Normocephalic and atraumatic.  Neck: Normal range of motion. Neck supple.  Cardiovascular: Normal rate.   CVA neg  Respiratory: Effort normal.  GI: Soft. There is no tenderness.  RUQ tenderness SP tenderness   Genitourinary:  deferred  Musculoskeletal: Normal range of motion.  Rt hip tenderness  Neurological: She is alert and oriented to person, place, and time.  Skin: Skin is warm and dry.  Psychiatric: She has a normal mood and affect.  EFM: 145 bpm, mod variability, +accels, no decels Toco: irritability Results for Stark BrayWHEELER, Alison CONRAD (MRN [id number]) as of 06/02/2015 15:57  Ref. Range 06/02/2015 13:29 06/02/2015 14:00  WBC Latest Ref Range: 4.0-10.5 K/uL 9.3   RBC Latest Ref Range: 3.87-5.11 MIL/uL 2.65 (L)   Hemoglobin Latest Ref Range: 12.0-15.0 g/dL 8.6 (L)   HCT Latest Ref Range: 36.0-46.0 % 24.7 (L)   MCV Latest Ref Range: 78.0-100.0 fL 93.2  MCH Latest Ref Range: 26.0-34.0 pg 32.5   MCHC Latest Ref Range: 30.0-36.0 g/dL 32.4   RDW Latest Ref Range: 11.5-15.5 % 13.4   Platelets Latest Ref Range: 150-400 K/uL 153   Appearance Latest Ref Range: CLEAR   CLEAR  Bacteria, UA Latest Ref Range: NONE SEEN   FEW (A)  Bilirubin Urine Latest Ref Range: NEGATIVE   NEGATIVE  Color, Urine Latest Ref Range: YELLOW   YELLOW  Glucose Latest Ref Range: NEGATIVE mg/dL  NEGATIVE  Hgb urine dipstick Latest Ref Range: NEGATIVE   SMALL (A)   Ketones, ur Latest Ref Range: NEGATIVE mg/dL  40 (A)  Leukocytes, UA Latest Ref Range: NEGATIVE   NEGATIVE  Nitrite Latest Ref Range: NEGATIVE   NEGATIVE  pH Latest Ref Range: 5.0-8.0   6.0  Protein Latest Ref Range: NEGATIVE mg/dL  NEGATIVE  RBC / HPF Latest Ref Range: 0-5 RBC/hpf  0-5  Specific Gravity, Urine Latest Ref Range: 1.005-1.030   >1.030 (H)  Squamous Epithelial / LPF Latest Ref Range: NONE SEEN   0-5 (A)  WBC, UA Latest Ref Range: 0-5 WBC/hpf  0-5   MAU Course  Procedures Labs IVF bolus LR x1 L Zofran 4 mg IV Tylenol 1000 mg po x1 Procardia 10 mg po x1 Betamethasone 12 mg IM x1  1540-reassessment- feeling much better, back pain resolved, only occ mild cramping, tolerating po w/o problem, no emesis or nausea; SVE 0.5/40/out of pelvis (no change from office exam)  Assessment and Plan  30.[redacted] weeks gestation Preterm contractions Dehydration Reactive NST IDA of pregnancy  Discharge home PTL precautions Rest Pelvic rest Continue PNV 1 po daily Start Niferex 150 mg po bid (Rx sent) Follow-up tomorrow at [facility] for f/u and BMX #2  A/P discussed with Dr. Finis Bud, Alison Turner, Alison Turner 06/02/2015, 12:56 PM

## 2015-06-02 NOTE — MAU Note (Signed)
Pt C/O uc's since last night, also lower back pain, could not sleep.  Seen @ [facility] office, 1 cm.  Pt states she was dx'd with UTI & dehydration.  Also C/O nausea, no vomiting.  Denies bleeding or LOF.

## 2015-06-02 NOTE — Discharge Instructions (Signed)
Dehydration, Adult °Dehydration is a condition in which you do not have enough fluid or water in your body. It happens when you take in less fluid than you lose. Vital organs such as the kidneys, brain, and heart cannot function without a proper amount of fluids. Any loss of fluids from the body can cause dehydration.  °Dehydration can range from mild to severe. This condition should be treated right away to help prevent it from becoming severe. °CAUSES  °This condition may be caused by: °· Vomiting. °· Diarrhea. °· Excessive sweating, such as when exercising in hot or humid weather. °· Not drinking enough fluid during strenuous exercise or during an illness. °· Excessive urine output. °· Fever. °· Certain medicines. °RISK FACTORS °This condition is more likely to develop in: °· People who are taking certain medicines that cause the body to lose excess fluid (diuretics).   °· People who have a chronic illness, such as diabetes, that may increase urination. °· Older adults.   °· People who live at high altitudes.   °· People who participate in endurance sports.   °SYMPTOMS  °Mild Dehydration °· Thirst. °· Dry lips. °· Slightly dry mouth. °· Dry, warm skin. °Moderate Dehydration °· Very dry mouth.   °· Muscle cramps.   °· Dark urine and decreased urine production.   °· Decreased tear production.   °· Headache.   °· Light-headedness, especially when you stand up from a sitting position.   °Severe Dehydration °· Changes in skin.   °¨ Cold and clammy skin.   °¨ Skin does not spring back quickly when lightly pinched and released.   °· Changes in body fluids.   °¨ Extreme thirst.   °¨ No tears.   °¨ Not able to sweat when body temperature is high, such as in hot weather.   °¨ Minimal urine production.   °· Changes in vital signs.   °¨ Rapid, weak pulse (more than 100 beats per minute when you are sitting still).   °¨ Rapid breathing.   °¨ Low blood pressure.   °· Other changes.   °¨ Sunken eyes.   °¨ Cold hands and feet.    °¨ Confusion. °¨ Lethargy and difficulty being awakened. °¨ Fainting (syncope).   °¨ Short-term weight loss.   °¨ Unconsciousness. °DIAGNOSIS  °This condition may be diagnosed based on your symptoms. You may also have tests to determine how severe your dehydration is. These tests may include:  °· Urine tests.   °· Blood tests.   °TREATMENT  °Treatment for this condition depends on the severity. Mild or moderate dehydration can often be treated at home. Treatment should be started right away. Do not wait until dehydration becomes severe. Severe dehydration needs to be treated at the hospital. °Treatment for Mild Dehydration °· Drinking plenty of water to replace the fluid you have lost.   °· Replacing minerals in your blood (electrolytes) that you may have lost.   °Treatment for Moderate Dehydration  °· Consuming oral rehydration solution (ORS). °Treatment for Severe Dehydration °· Receiving fluid through an IV tube.   °· Receiving electrolyte solution through a feeding tube that is passed through your nose and into your stomach (nasogastric tube or NG tube). °· Correcting any abnormalities in electrolytes. °HOME CARE INSTRUCTIONS  °· Drink enough fluid to keep your urine clear or pale yellow.   °· Drink water or fluid slowly by taking small sips. You can also try sucking on ice cubes.  °· Have food or beverages that contain electrolytes. Examples include bananas and sports drinks. °· Take over-the-counter and prescription medicines only as told by your health care provider.   °· Prepare ORS according to the manufacturer's instructions. Take sips   of ORS every 5 minutes until your urine returns to normal.  If you have vomiting or diarrhea, continue to try to drink water, ORS, or both.   If you have diarrhea, avoid:   Beverages that contain caffeine.   Fruit juice.   Milk.   Carbonated soft drinks.  Do not take salt tablets. This can lead to the condition of having too much sodium in your body  (hypernatremia).  SEEK MEDICAL CARE IF:  You cannot eat or drink without vomiting.  You have had moderate diarrhea during a period of more than 24 hours.  You have a fever. SEEK IMMEDIATE MEDICAL CARE IF:   You have extreme thirst.  You have severe diarrhea.  You have not urinated in 6-8 hours, or you have urinated only a small amount of very dark urine.  You have shriveled skin.  You are dizzy, confused, or both.   This information is not intended to replace advice given to you by your health care provider. Make sure you discuss any questions you have with your health care provider.   Document Released: 06/21/2005 Document Revised: 03/12/2015 Document Reviewed: 11/06/2014 Elsevier Interactive Patient Education 2016 Elsevier Inc[employer] of the uterus can occur throughout pregnancy. Contractions are not always a sign that you are in labor.  WHAT ARE BRAXTON HICKS CONTRACTIONS?  Contractions that occur before labor are called Braxton Hicks contractions, or false labor. Toward the end of pregnancy (32-34 weeks), these contractions can develop more often and may become more forceful. This is not true labor because these contractions do not result in opening (dilatation) and thinning of the cervix. They are sometimes difficult to tell apart from true labor because these contractions can be forceful and people have different pain tolerances. You should not feel embarrassed if you go to the hospital with false labor. Sometimes, the only way to tell if you are in true labor is for your health care provider to look for changes in the cervix. If there are no prenatal problems or other health problems associated with the pregnancy, it is completely safe to be sent home with false labor and await the onset of true labor. HOW CAN YOU TELL THE DIFFERENCE BETWEEN TRUE AND FALSE LABOR? False Labor  The contractions of false labor are usually shorter and not as hard  as those of true labor.   The contractions are usually irregular.   The contractions are often felt in the front of the lower abdomen and in the groin.   The contractions may go away when you walk around or change positions while lying down.   The contractions get weaker and are shorter lasting as time goes on.   The contractions do not usually become progressively stronger, regular, and closer together as with true labor.  True Labor  Contractions in true labor last 30-70 seconds, become very regular, usually become more intense, and increase in frequency.   The contractions do not go away with walking.   The discomfort is usually felt in the top of the uterus and spreads to the lower abdomen and low back.   True labor can be determined by your health care provider with an exam. This will show that the cervix is dilating and getting thinner.  WHAT TO REMEMBER  Keep up with your usual exercises and follow other instructions given by your health care provider.   Take medicines as directed by your health care provider.   Keep your regular prenatal appointments.  Eat and drink lightly if you think you are going into labor.   If Braxton Hicks contractions are making you uncomfortable:   Change your position from lying down or resting to walking, or from walking to resting.   Sit and rest in a tub of warm water.   Drink 2-3 glasses of water. Dehydration may cause these contractions.   Do slow and deep breathing several times an hour.  WHEN SHOULD I SEEK IMMEDIATE MEDICAL CARE? Seek immediate medical care if:  Your contractions become stronger, more regular, and closer together.   You have fluid leaking or gushing from your vagina.   You have a fever.   You pass blood-tinged mucus.   You have vaginal bleeding.   You have continuous abdominal pain.   You have low back pain that you never had before.   You feel your baby's head pushing down  and causing pelvic pressure.   Your baby is not moving as much as it used to.    This information is not intended to replace advice given to you by your health care provider. Make sure you discuss any questions you have with your health care provider.   Document Released: 06/21/2005 Document Revised: 06/26/2013 Document Reviewed: 04/02/2013 Elsevier Interactive Patient Education [employer] Elsevier Inc.

## 2015-06-02 NOTE — MAU Note (Signed)
[facility] contractions every 4 minutes received orders to admin Procardia and run second bag of fluid as a bolus.

## 2015-06-19 ENCOUNTER — Inpatient Hospital Stay (HOSPITAL_COMMUNITY): Payer: 59

## 2015-06-19 ENCOUNTER — Inpatient Hospital Stay (HOSPITAL_COMMUNITY)
Admission: AD | Admit: 2015-06-19 | Discharge: 2015-06-19 | Disposition: A | Discharge status: Home / Self Care | Payer: 59 | Source: Ambulatory Visit | Attending: Obstetrics and Gynecology | Admitting: Obstetrics and Gynecology

## 2015-06-19 ENCOUNTER — Encounter (HOSPITAL_COMMUNITY): Payer: Self-pay

## 2015-06-19 DIAGNOSIS — O43123 Velamentous insertion of umbilical cord, third trimester: Secondary | ICD-10-CM | POA: Insufficient documentation

## 2015-06-19 DIAGNOSIS — W109XXA Fall (on) (from) unspecified stairs and steps, initial encounter: Secondary | ICD-10-CM | POA: Insufficient documentation

## 2015-06-19 DIAGNOSIS — O26893 Other specified pregnancy related conditions, third trimester: Secondary | ICD-10-CM | POA: Diagnosis not present

## 2015-06-19 DIAGNOSIS — Z3A32 32 weeks gestation of pregnancy: Secondary | ICD-10-CM | POA: Diagnosis not present

## 2015-06-19 DIAGNOSIS — O9A213 Injury, poisoning and certain other consequences of external causes complicating pregnancy, third trimester: Secondary | ICD-10-CM

## 2015-06-19 HISTORY — DX: Injury, poisoning and certain other consequences of external causes complicating pregnancy, third trimester: O9A.213

## 2015-06-19 LAB — KLEIHAUER-BETKE STAIN
# Vials RhIg: 1
FETAL CELLS %: 0 %
QUANTITATION FETAL HEMOGLOBIN: 0 mL

## 2015-06-19 NOTE — MAU Provider Note (Signed)
History     CSN: [id number]  Arrival date and time: 06/19/15 1641 Provider notified: 1705 Provider on unit: 1735 Provider at bedside: 1750     Chief Complaint  Patient presents with  . Fall   HPI  [name] is a 24 yo G2P0010 female at 32.[redacted] wks gestation by ultrasound presenting today d/t falling on stairs outside of husband's job.  She reports that her "legs buckled" causing her to fall down, hitting both knees and elbows. She denies hitting her abdomen, VB or LOF.  She does, however, report having some contractions off and on, but "the contractions are not painful and no different than she already has everyday". (+) FM. Her prenatal care has been complicated by: MCI, preterm contractions, IDA, and preterm cervical dilation (1cm - stable). She received BMZ x 2 doses (11/28 & 11/29).  Her primary OC provider at [facility] is Donette LarryMelanie Bhambri, CNM.  Past Medical History  Diagnosis Date  . Kidney calculi   . Traumatic injury during pregnancy in third trimester 06/19/2015    Past Surgical History  Procedure Laterality Date  . Mouth surgery      History reviewed. No pertinent family history.  Social History  Substance Use Topics  . Smoking status: Never Smoker   . Smokeless tobacco: None  . Alcohol Use: No    Allergies: No Known Allergies  No prescriptions prior to admission    Review of Systems  Constitutional: Negative.   HENT: Negative.   Eyes: Negative.   Respiratory: Negative.   Cardiovascular: Negative.   Gastrointestinal: Negative.   Genitourinary:       Occ BH contractions; lots of FM; increased vaginal discharge in last week - no complaints  Musculoskeletal: Negative.   Skin: Negative.   Neurological: Negative.   Endo/Heme/Allergies: Negative.   Psychiatric/Behavioral: Negative.    CEFM  FHR: 140 bpm / moderate variability / accels present / decels absent TOCO: Occ UC's  OB Limited U/S AFI WNL (16.21) / No abruption seen, RT lateral placenta,  above cervical os / FHR 140 bpm  Results for orders placed or performed during the hospital encounter of 06/19/15 (from the past 24 hour(s))  Kleihauer-Betke stain     Status: None   Collection Time: 06/19/15  6:13 PM  Result Value Ref Range   Fetal Cells % 0 %   Quantitation Fetal Hemoglobin 0 mL   # Vials RhIg 1    Physical Exam   Blood pressure 114/66, pulse 88, temperature 98 F (36.7 C), temperature source Oral, resp. rate 18.  Physical Exam  Constitutional: She is oriented to person, place, and time. She appears well-developed and well-nourished.  HENT:  Head: Normocephalic and atraumatic.  Neck: Normal range of motion.  Cardiovascular: Normal rate, regular rhythm, normal heart sounds and intact distal pulses.   Respiratory: Effort normal and breath sounds normal.  GI: Soft. Bowel sounds are normal.  Genitourinary:  VE: 1.5 cm / 60% / -3 / vtx / ballotable; no VB; copious white vaginal discharge  Musculoskeletal: Normal range of motion.  Neurological: She is alert and oriented to person, place, and time. She has normal reflexes.  Skin: Skin is warm and dry.  Psychiatric: She has a normal mood and affect. Her behavior is normal. Judgment and thought content normal.    MAU Course  Procedures Prolonged NST x 4 hours  KLB - no fetal cells OB Limited U/S - normal Assessment and Plan  G2P0010 at 32.[redacted] wks gestation S/P Traumatic Injury (  Fall) in Pregnancy, third trimester Marginal Cord Insertion Category 1 FHR tracing  Discharge home after prolonged NST Bleeding and preterm labor precautions reviewed [facility] instructions reviewed Keep scheduled appt in 2 wks Call the office for any further problems, questions or concerns   Kenard Gower MSN, CNM 06/20/2015, 6:00 PM

## 2015-06-19 NOTE — Discharge Instructions (Signed)
What Do I Need to Know About Injuries During Pregnancy? Trauma is the most common cause of injury and death in pregnant women. This can also result in significant harm or death of the baby. Your baby is protected in the womb (uterus) by a sac filled with fluid (amniotic sac). Your baby can be harmed if there is direct, high-impact trauma to your abdomen and pelvis. This type of trauma can result in tearing of your uterus, the placenta pulling away from the wall of the uterus (placenta abruption), or the amniotic sac breaking open (rupture of membranes). These injuries can decrease or stop the blood supply to your baby or cause you to go into labor earlier than expected. Minor falls and low-impact automobile accidents do not usually harm your baby, even if they do minimally harm you. WHAT KIND OF INJURIES CAN AFFECT MY PREGNANCY? The most common causes of injury or death to a baby include:  Falls. Falls are more common in the second and third trimester of the pregnancy. Factors that increase your risk of falling include:  Increase in your weight.  The change in your center of gravity.  Tripping over an object that cannot be seen.  Increased looseness (laxity) of your ligaments resulting in less coordinated movements (you may feel clumsy).  Falling during high-risk activities like horseback riding or skiing.  Automobile accidents. It is important to wear your seat belt properly, with the lap belt below your abdomen, and always practice safe driving.  Domestic violence or assault.  Burns (Metallurgistfire or electrical). The most common causes of injury or death to the pregnant woman include:  Injuries that cause severe bleeding, shock, and loss of blood flow to major organs.  Head and neck injuries that result in severe brain or spinal damage.  Chest trauma that can cause direct injury to the heart and lungs or any injury that affects the area enclosed by the ribs. Trauma to this area can result in  cardiorespiratory arrest. WHAT CAN I DO TO PROTECT MYSELF AND MY BABY FROM INJURY WHILE I AM PREGNANT?  Remove slippery rugs and loose objects on the floor that increase your risk of tripping.  Avoid walking on wet or slippery floors.  Wear comfortable shoes that have a good grip on the sole. Do not wear high-heeled shoes.  Always wear your seat belt properly, with the lap belt below your abdomen, and always practice safe driving. Do not ride on a motorcycle while pregnant.  Do not participate in high-impact activities or sports.  Avoid fires, starting fires, lifting heavy pots of boiling or hot liquids, and fixing electrical problems.  Only take over-the-counter or prescription medicines for pain, fever, or discomfort as directed by your health care provider.  Know your blood type and the father's blood type in case you develop vaginal bleeding or experience an injury for which a blood transfusion may be necessary.  Call your local emergency services (911 in the [place] if you are a victim of domestic violence or assault. Spousal abuse can be a significant cause of trauma during pregnancy. For help and support, contact the [employer] Domestic Violence Hotline. WHEN SHOULD I SEEK IMMEDIATE MEDICAL CARE?   You fall on your abdomen or experience any high-force accident or injury.  You have been assaulted (domestic or otherwise).  You have been in a car accident.  You develop vaginal bleeding.  You develop fluid leaking from the vagina.  You develop uterine contractions (pelvic cramping, pain, or significant low back  pain).  You become weak or faint, or have uncontrolled vomiting after trauma.  You had a serious burn. This includes burns to the face, neck, hands, or genitals, or burns greater than the size of your palm anywhere else.  You develop neck stiffness or pain after a fall or from other trauma.  You develop a headache or vision problems after a fall or from other  trauma.  You do not feel the baby moving or the baby is not moving as much as before a fall or other trauma.   This information is not intended to replace advice given to you by your health care provider. Make sure you discuss any questions you have with your health care provider.   Document Released: 07/29/2004 Document Revised: 07/12/2014 Document Reviewed: 03/28/2013 Elsevier Interactive Patient Education 2016 [employer]. Placental Abruption - INFORMATION ONLY Your placenta is the organ that nourishes your unborn baby (fetus). Your baby gets his or her blood supply and nutrients through your placenta. It is your baby's life support system. It is attached to the inside of your uterus until after your baby is born.  Placental abruption is when the placenta partly or completely separates from the uterus before your baby is born. This is rare, but it can happen any time after 20 weeks of pregnancy. A small separation may not cause problems, but a large separation may be dangerous for you and your baby. CAUSES  Most of the time the cause of a placental abruption is unknown. Though it is rare, a placental abruption can be caused by:   An abdominal injury.   The baby turning from a buttocks-first position (breech presentation) or a sideways position (transverse) to a headfirst position (cephalic).   Delivering the first of multiple babies (twins, triplets, or more).   Sudden loss of amniotic fluid (premature rupture of the membranes).   An abnormally short umbilical cord. RISK FACTORS Some risk factors make a placental abruption more likely, including:  History of placental abruption.  High blood pressure (hypertension).  Smoking.  Alcohol intake.  Blood clotting problems.  Too much amniotic fluid.  Having had multiples (twins or triplets or more).  Seizures and convulsions.  Diabetes mellitus.  Having had more than four children.  Age 24 years or older.  Illegal  drug use.  Injury to your abdomen. SIGNS AND SYMPTOMS  A small placental abruption may not cause symptoms. If you do have symptoms, they may include:  Mild abdominal pain.  Slight vaginal bleeding. Symptoms of severe placental abruption depend on the size of the separation and the stage of pregnancy. Symptoms may include:   Sudden pain in your uterus.  Abdominal pain.  Vaginal bleeding.  Tender uterus.  Severe abdominal pain with tenderness.  Continual contractions of your uterus.  Back pain.  Weakness, light-headedness. DIAGNOSIS  Placental abruption is suspected when a pregnant woman develops sudden pain in her uterus. The health care provider will check whether the uterus is very tender, hard, and enlarging and whether the baby has an abnormal heart rate or rhythm. Ultrasonography (commonly called an ultrasound) will be done. Blood work will also be done to make sure that there are enough healthy red blood cells and that there are no clotting problems or signs of too much blood loss. TREATMENT  Placental abruption is usually an emergency. It requires treatment right away. Your treatment will depend on:   The amount of bleeding.  Whether you or you baby are in distress.  The  stage of your pregnancy.  The maturity of the baby. Treatment for partial separation of the placenta is bed rest and close observation. You also may need a blood transfusion or to receive fluids through an IV tube. Treatment for complete placental separation is delivery of your baby. You may have a cesarean delivery if your baby is in distress. HOME CARE INSTRUCTIONS   Only take medicines as directed by your health care provider.  Arrange for help at home before and after you deliver the baby, especially if you had a cesarean delivery or lost a lot of blood.  Get plenty of rest and sleep.  Do not have sexual intercourse until your health care provider says it is okay.  Do not use tampons or  douche unless your health care provider says it is okay. SEEK MEDICAL CARE IF:  You have light vaginal bleeding or spotting.  You have any type of trauma, such as a fall or jolt during an accident.  You are having trouble avoiding drugs, alcohol, or smoking. SEEK IMMEDIATE MEDICAL CARE IF:  You have vaginal bleeding.  You have abdominal pain.  You have continuous uterine contractions.  You have a hard, tender uterus.  You do not feel the baby move, or the baby moves very little. MAKE SURE YOU:  Understand these instructions.  Will watch your condition.  Will get help right away if you are not doing well or get worse.   This information is not intended to replace advice given to you by your health care provider. Make sure you discuss any questions you have with your health care provider.   Document Released: 06/21/2005 Document Revised: 06/26/2013 Document Reviewed: 04/13/2013 Elsevier Interactive Patient Education 2016 [employer] Preterm Labor Information Preterm labor is when labor starts at less than 37 weeks of pregnancy. The normal length of a pregnancy is 39 to 41 weeks. CAUSES Often, there is no identifiable underlying cause as to why a woman goes into preterm labor. One of the most common known causes of preterm labor is infection. Infections of the uterus, cervix, vagina, amniotic sac, bladder, kidney, or even the lungs (pneumonia) can cause labor to start. Other suspected causes of preterm labor include:   Urogenital infections, such as yeast infections and bacterial vaginosis.   Uterine abnormalities (uterine shape, uterine septum, fibroids, or bleeding from the placenta).   A cervix that has been operated on (it may fail to stay closed).   Malformations in the fetus.   Multiple gestations (twins, triplets, and so on).   Breakage of the amniotic sac.  RISK FACTORS  Having a previous history of preterm labor.   Having premature rupture of  membranes (PROM).   Having a placenta that covers the opening of the cervix (placenta previa).   Having a placenta that separates from the uterus (placental abruption).   Having a cervix that is too weak to hold the fetus in the uterus (incompetent cervix).   Having too much fluid in the amniotic sac (polyhydramnios).   Taking illegal drugs or smoking while pregnant.   Not gaining enough weight while pregnant.   Being younger than 3618 and older than 8972 years old.   Having a low socioeconomic status.   Being African American. SYMPTOMS Signs and symptoms of preterm labor include:   Menstrual-like cramps, abdominal pain, or back pain.  Uterine contractions that are regular, as frequent as six in an hour, regardless of their intensity (may be mild or painful).  Contractions that start  on the top of the uterus and spread down to the lower abdomen and back.   A sense of increased pelvic pressure.   A watery or bloody mucus discharge that comes from the vagina.  TREATMENT Depending on the length of the pregnancy and other circumstances, your health care provider may suggest bed rest. If necessary, there are medicines that can be given to stop contractions and to mature the fetal lungs. If labor happens before 34 weeks of pregnancy, a prolonged hospital stay may be recommended. Treatment depends on the condition of both you and the fetus.  WHAT SHOULD YOU DO IF YOU THINK YOU ARE IN PRETERM LABOR? Call your health care provider right away. You will need to go to the hospital to get checked immediately. HOW CAN YOU PREVENT PRETERM LABOR IN FUTURE PREGNANCIES? You should:   Stop smoking if you smoke.  Maintain healthy weight gain and avoid chemicals and drugs that are not necessary.  Be watchful for any type of infection.  Inform your health care provider if you have a known history of preterm labor.   This information is not intended to replace advice given to you by  your health care provider. Make sure you discuss any questions you have with your health care provider.   Document Released: 09/11/2003 Document Revised: 02/21/2013 Document Reviewed: 07/24/2012 Elsevier Interactive Patient Education [employer]

## 2015-06-19 NOTE — MAU Note (Signed)
Was walking up the stairs, legs gave out.  Hit her knees and went down on forearms.  Did not strike abd.

## 2015-06-19 NOTE — Progress Notes (Signed)
Notified of pt arrival in MAU and complaint. Will come see pt 

## 2015-06-19 NOTE — MAU Note (Signed)
Urine sent to lab 

## 2015-06-27 ENCOUNTER — Encounter (HOSPITAL_COMMUNITY): Payer: Self-pay | Admitting: *Deleted

## 2015-06-27 ENCOUNTER — Inpatient Hospital Stay (HOSPITAL_COMMUNITY)
Admission: AD | Admit: 2015-06-27 | Discharge: 2015-06-27 | Disposition: A | Discharge status: Home / Self Care | Payer: 59 | Source: Ambulatory Visit | Attending: Obstetrics and Gynecology | Admitting: Obstetrics and Gynecology

## 2015-06-27 DIAGNOSIS — Z87442 Personal history of urinary calculi: Secondary | ICD-10-CM | POA: Insufficient documentation

## 2015-06-27 DIAGNOSIS — Z8249 Family history of ischemic heart disease and other diseases of the circulatory system: Secondary | ICD-10-CM | POA: Insufficient documentation

## 2015-06-27 DIAGNOSIS — N930 Postcoital and contact bleeding: Secondary | ICD-10-CM | POA: Insufficient documentation

## 2015-06-27 DIAGNOSIS — Z3A34 34 weeks gestation of pregnancy: Secondary | ICD-10-CM | POA: Diagnosis not present

## 2015-06-27 DIAGNOSIS — O4703 False labor before 37 completed weeks of gestation, third trimester: Secondary | ICD-10-CM | POA: Diagnosis not present

## 2015-06-27 DIAGNOSIS — O4693 Antepartum hemorrhage, unspecified, third trimester: Secondary | ICD-10-CM

## 2015-06-27 DIAGNOSIS — O9A213 Injury, poisoning and certain other consequences of external causes complicating pregnancy, third trimester: Secondary | ICD-10-CM

## 2015-06-27 MED ORDER — BETAMETHASONE SOD PHOS & ACET 6 (3-3) MG/ML IJ SUSP
12.0000 mg | Freq: Once | INTRAMUSCULAR | Status: AC
  Administered 2015-06-27: 12 mg via INTRAMUSCULAR
  Filled 2015-06-27: qty 2

## 2015-06-27 NOTE — Discharge Instructions (Signed)

## 2015-06-27 NOTE — MAU Provider Note (Signed)
History     CSN: [id number]  Arrival date and time: 06/27/15 1003  Orders placed in EPIC: 1028 Provider not notified of patient's arrival Provider at bedside: 1105     Chief Complaint  Patient presents with  . Contractions   HPI  [name] is a 24 yo G2P0010 female at 34.0 wks by ultrasound, sent from the office for CEFM, BMZ injection #3 and VE.  She was seen in the office this morning for postcoital bleeding and UC's every 3.5 mins.  She was dilated 4/80/-3/vtx per Dr. Seymour Bars. She states that UC's were "pretty frequent and painful on the car ride to the hospital". Reports the bleeding decreased since arriving at the hospital. Her prenatal care has been complicated by: marginal cord insertion, preterm contractions, preterm dilation, symptomatic UTI. Her primary OB provider at [facility] is: Alison Turner, CNM.  Past Medical History  Diagnosis Date  . Kidney calculi   . Traumatic injury during pregnancy in third trimester 06/19/2015    Past Surgical History  Procedure Laterality Date  . Mouth surgery      Family History  Problem Relation Age of Onset  . Hypertension Mother   . Kidney disease Father   . Cancer Maternal Grandmother   . Diabetes Maternal Grandmother   . Kidney disease Maternal Grandfather   . Heart disease Maternal Grandfather   . Hypertension Paternal Grandmother   . Heart disease Paternal Grandmother   . Kidney disease Paternal Grandfather   . Heart disease Paternal Grandfather     Social History  Substance Use Topics  . Smoking status: Never Smoker   . Smokeless tobacco: None  . Alcohol Use: No    Allergies: No Known Allergies  Prescriptions prior to admission  Medication Sig Dispense Refill Last Dose  . calcium carbonate (TUMS - DOSED IN MG ELEMENTAL CALCIUM) 500 MG chewable tablet Chew 1-3 tablets by mouth daily as needed for indigestion or heartburn.   Past Week at Unknown time  . IRON PO Take 15 mLs by mouth daily.   Past Week at  Unknown time  . Prenatal Vit-Fe Fumarate-FA (PRENATAL MULTIVITAMIN) TABS tablet Take 1 tablet by mouth daily at 12 noon.   Past Week at Unknown time    Review of Systems  Constitutional: Negative.   HENT: Negative.   Eyes: Negative.   Respiratory: Negative.   Cardiovascular: Negative.   Gastrointestinal: Negative.   Genitourinary:       UC's every 3.5 mins @ 0600, now less frequent; BR VB after SI this morning  Musculoskeletal: Negative.   Skin: Negative.   Neurological: Negative.   Endo/Heme/Allergies: Negative.   Psychiatric/Behavioral: Negative.    CEFM  FHR: 150 bpm / moderate variability / accels present / decels absent TOCO: regular every 4-6 mins / decreased to irregular UI prior to d/c home  Physical Exam   Blood pressure 111/68, pulse 86, temperature 99 F (37.2 C), temperature source Oral, resp. rate 16.  Physical Exam  Constitutional: She is oriented to person, place, and time. She appears well-developed and well-nourished.  HENT:  Head: Normocephalic.  Eyes: Pupils are equal, round, and reactive to light.  Neck: Normal range of motion.  Cardiovascular: Normal rate, regular rhythm, normal heart sounds and intact distal pulses.   Respiratory: Effort normal and breath sounds normal.  GI: Soft. Bowel sounds are normal.  Genitourinary:  VE: 4/80/-3/vtx - exam unchanged after 1 hr of observation; abd soft and non-tender  Musculoskeletal: Normal range of motion.  Neurological:  She is alert and oriented to person, place, and time.  Skin: Skin is warm and dry.  Psychiatric: She has a normal mood and affect. Her behavior is normal. Judgment and thought content normal.    MAU Course  Procedures CEFM BMZ #3 IM injection  Assessment and Plan  G2P0010 at [redacted] wks gestation Postcoital Bleeding Preterm Contractions Preterm Cervical Dilation  Discharge home  PTL precautions  Keep scheduled appointment with [facility] Call the office for any further questions, problems or  concerns  Kenard GowerDAWSON, Mirtie Bastyr, M MSN, CNM 06/27/2015, 11:05 AM

## 2015-06-27 NOTE — MAU Note (Signed)
Pt sent from MD office - went in because of increased uc's & spotting this a.m.  SVE in office 4 cm's.  Pt denies LOF.

## 2015-07-06 NOTE — L&D Delivery Note (Signed)
Delivery Note  First Stage: Labor onset: 0800 Augmentation: AROM Analgesia /Anesthesia intrapartum: none AROM at 1424  Second Stage: Complete dilation at 1702 Onset of pushing at 1705 FHR second stage 120 bpm, mod variability, +accels, occ variable  In maternal lithotomy, delivery of a viable female at [id number] by CNM in OA position with restitution to LOT Loose nuchal cord x1, reduced Cord double clamped after cessation of pulsation, cut by [facility] Cord blood sample collected   Collection of cord blood donation: yes Arterial cord blood sample n/a  Third Stage: Placenta delivered via Schult intact with 3 VC @ 1815-marginal cord insertion, subjectively small Placenta disposition: release to pt Uterine tone firm / bleeding small  Bilateral labia minora lacerations identified  Anesthesia for repair: local Repaired with 3-0 & 4-0 Vicryl rapide Est. Blood Loss (mL): 233  Complications: none  Mom to postpartum.  Baby to Couplet care / Skin to Skin.  Newborn: Birth Weight: pending Apgar Scores: 7/8 Feeding planned: breast  Alison Turner, [name], N MSN, CNM 07/17/2015, 6:46 PM

## 2015-07-17 ENCOUNTER — Inpatient Hospital Stay (HOSPITAL_COMMUNITY)
Admission: AD | Admit: 2015-07-17 | Discharge: 2015-07-19 | DRG: 775 | Disposition: A | Discharge status: Home / Self Care | Payer: 59 | Source: Ambulatory Visit | Attending: Obstetrics | Admitting: Obstetrics

## 2015-07-17 ENCOUNTER — Encounter (HOSPITAL_COMMUNITY): Payer: Self-pay | Admitting: *Deleted

## 2015-07-17 DIAGNOSIS — Z3A36 36 weeks gestation of pregnancy: Secondary | ICD-10-CM | POA: Diagnosis not present

## 2015-07-17 DIAGNOSIS — D62 Acute posthemorrhagic anemia: Secondary | ICD-10-CM | POA: Diagnosis not present

## 2015-07-17 DIAGNOSIS — O99824 Streptococcus B carrier state complicating childbirth: Secondary | ICD-10-CM | POA: Diagnosis present

## 2015-07-17 DIAGNOSIS — D509 Iron deficiency anemia, unspecified: Secondary | ICD-10-CM | POA: Diagnosis present

## 2015-07-17 DIAGNOSIS — Z8249 Family history of ischemic heart disease and other diseases of the circulatory system: Secondary | ICD-10-CM

## 2015-07-17 DIAGNOSIS — O9081 Anemia of the puerperium: Secondary | ICD-10-CM | POA: Diagnosis not present

## 2015-07-17 DIAGNOSIS — O9A213 Injury, poisoning and certain other consequences of external causes complicating pregnancy, third trimester: Secondary | ICD-10-CM

## 2015-07-17 DIAGNOSIS — Z833 Family history of diabetes mellitus: Secondary | ICD-10-CM

## 2015-07-17 DIAGNOSIS — O99019 Anemia complicating pregnancy, unspecified trimester: Secondary | ICD-10-CM

## 2015-07-17 LAB — CBC
HCT: 30.3 % — ABNORMAL LOW (ref 36.0–46.0)
Hemoglobin: 10.4 g/dL — ABNORMAL LOW (ref 12.0–15.0)
MCH: 31.6 pg (ref 26.0–34.0)
MCHC: 34.3 g/dL (ref 30.0–36.0)
MCV: 92.1 fL (ref 78.0–100.0)
PLATELETS: 185 10*3/uL (ref 150–400)
RBC: 3.29 MIL/uL — AB (ref 3.87–5.11)
RDW: 13.6 % (ref 11.5–15.5)
WBC: 8.8 10*3/uL (ref 4.0–10.5)

## 2015-07-17 LAB — TYPE AND SCREEN
ABO/RH(D): A POS
Antibody Screen: NEGATIVE

## 2015-07-17 LAB — ABO/RH: ABO/RH(D): A POS

## 2015-07-17 LAB — OB RESULTS CONSOLE ANTIBODY SCREEN: Antibody Screen: NEGATIVE

## 2015-07-17 LAB — OB RESULTS CONSOLE RPR: RPR: NONREACTIVE

## 2015-07-17 LAB — OB RESULTS CONSOLE HIV ANTIBODY (ROUTINE TESTING): HIV: NONREACTIVE

## 2015-07-17 LAB — OB RESULTS CONSOLE GBS: STREP GROUP B AG: POSITIVE

## 2015-07-17 LAB — OB RESULTS CONSOLE ABO/RH: RH TYPE: POSITIVE

## 2015-07-17 LAB — OB RESULTS CONSOLE HEPATITIS B SURFACE ANTIGEN: HEP B S AG: NEGATIVE

## 2015-07-17 LAB — OB RESULTS CONSOLE RUBELLA ANTIBODY, IGM: Rubella: IMMUNE

## 2015-07-17 MED ORDER — IBUPROFEN 600 MG PO TABS
600.0000 mg | ORAL_TABLET | Freq: Four times a day (QID) | ORAL | Status: DC
  Administered 2015-07-18 – 2015-07-19 (×5): 600 mg via ORAL
  Filled 2015-07-17 (×5): qty 1

## 2015-07-17 MED ORDER — DEXTROSE 5 % IV SOLN
5.0000 10*6.[IU] | Freq: Once | INTRAVENOUS | Status: AC
  Administered 2015-07-17: 5 10*6.[IU] via INTRAVENOUS
  Filled 2015-07-17: qty 5

## 2015-07-17 MED ORDER — LIDOCAINE HCL (PF) 1 % IJ SOLN
30.0000 mL | INTRAMUSCULAR | Status: AC | PRN
  Administered 2015-07-17: 30 mL via SUBCUTANEOUS
  Filled 2015-07-17: qty 30

## 2015-07-17 MED ORDER — BENZOCAINE-MENTHOL 20-0.5 % EX AERO
1.0000 "application " | INHALATION_SPRAY | CUTANEOUS | Status: DC | PRN
  Administered 2015-07-17 – 2015-07-19 (×2): 1 via TOPICAL
  Filled 2015-07-17 (×3): qty 56

## 2015-07-17 MED ORDER — OXYTOCIN 10 UNIT/ML IJ SOLN
2.5000 [IU]/h | INTRAVENOUS | Status: DC
  Filled 2015-07-17: qty 4

## 2015-07-17 MED ORDER — PENICILLIN G POTASSIUM 5000000 UNITS IJ SOLR
2.5000 10*6.[IU] | INTRAVENOUS | Status: DC
  Administered 2015-07-17: 2.5 10*6.[IU] via INTRAVENOUS
  Filled 2015-07-17 (×4): qty 2.5

## 2015-07-17 MED ORDER — OXYCODONE-ACETAMINOPHEN 5-325 MG PO TABS: 1.0000 | ORAL_TABLET | ORAL | Status: DC | PRN

## 2015-07-17 MED ORDER — ONDANSETRON HCL 4 MG/2ML IJ SOLN
4.0000 mg | INTRAMUSCULAR | Status: DC | PRN
Start: 2015-07-17 — End: 2015-07-19

## 2015-07-17 MED ORDER — CITRIC ACID-SODIUM CITRATE 334-500 MG/5ML PO SOLN
30.0000 mL | ORAL | Status: DC | PRN
  Administered 2015-07-17 (×2): 30 mL via ORAL
  Filled 2015-07-17 (×2): qty 15

## 2015-07-17 MED ORDER — MISOPROSTOL 200 MCG PO TABS: 800.0000 ug | ORAL_TABLET | Freq: Once | ORAL | Status: DC

## 2015-07-17 MED ORDER — LANOLIN HYDROUS EX OINT: TOPICAL_OINTMENT | CUTANEOUS | Status: DC | PRN

## 2015-07-17 MED ORDER — OXYCODONE-ACETAMINOPHEN 5-325 MG PO TABS: 2.0000 | ORAL_TABLET | ORAL | Status: DC | PRN

## 2015-07-17 MED ORDER — OXYTOCIN BOLUS FROM INFUSION
500.0000 mL | INTRAVENOUS | Status: DC
  Administered 2015-07-17: 500 mL via INTRAVENOUS

## 2015-07-17 MED ORDER — ONDANSETRON HCL 4 MG PO TABS: 4.0000 mg | ORAL_TABLET | ORAL | Status: DC | PRN

## 2015-07-17 MED ORDER — ONDANSETRON HCL 4 MG/2ML IJ SOLN: 4.0000 mg | Freq: Four times a day (QID) | INTRAMUSCULAR | Status: DC | PRN

## 2015-07-17 MED ORDER — FENTANYL CITRATE (PF) 100 MCG/2ML IJ SOLN
100.0000 ug | Freq: Once | INTRAMUSCULAR | Status: AC
  Administered 2015-07-17: 100 ug via INTRAVENOUS
  Filled 2015-07-17: qty 2

## 2015-07-17 MED ORDER — SENNOSIDES-DOCUSATE SODIUM 8.6-50 MG PO TABS
2.0000 | ORAL_TABLET | ORAL | Status: DC
  Administered 2015-07-18 (×2): 2 via ORAL
  Filled 2015-07-17 (×2): qty 2

## 2015-07-17 MED ORDER — OXYTOCIN 10 UNIT/ML IJ SOLN: 10.0000 [IU] | Freq: Once | INTRAMUSCULAR | Status: DC

## 2015-07-17 MED ORDER — LACTATED RINGERS IV SOLN: INTRAVENOUS | Status: DC

## 2015-07-17 MED ORDER — ACETAMINOPHEN 325 MG PO TABS: 650.0000 mg | ORAL_TABLET | ORAL | Status: DC | PRN

## 2015-07-17 MED ORDER — LACTATED RINGERS IV SOLN: 500.0000 mL | INTRAVENOUS | Status: DC | PRN

## 2015-07-17 MED ORDER — MISOPROSTOL 200 MCG PO TABS
ORAL_TABLET | ORAL | Status: AC
  Filled 2015-07-17: qty 4

## 2015-07-17 MED ORDER — PRENATAL MULTIVITAMIN CH
1.0000 | ORAL_TABLET | Freq: Every day | ORAL | Status: DC
  Administered 2015-07-18 – 2015-07-19 (×2): 1 via ORAL
  Filled 2015-07-17 (×2): qty 1

## 2015-07-17 MED ORDER — LACTATED RINGERS IV SOLN
INTRAVENOUS | Status: DC
  Administered 2015-07-17: 15:00:00 via INTRAUTERINE

## 2015-07-17 NOTE — H&P (Signed)
  OB ADMISSION/ HISTORY & PHYSICAL:  Admission Date: 07/17/2015  9:42 AM  Admit Diagnosis: 36.[redacted] weeks gestation  Alison Turner is a 25 y.o. female presenting for labor.  Prenatal History: G2P0010   EDC:08/08/2015 by [facility]  Primary Ob Provider: Donette LarryMelanie Jill Stopka, CNM Prenatal course complicated by [facility] resolved at 15 wks, marginal CI with normal growth-EFW 5-1 (29%) at 35 wks, preterm dilation at 30 wks-1cm with slow progression to 4cm at 34 wks, Betamethasone x2 and booster, IDA.  Prenatal Labs: ABO, Rh:   A Pos Antibody:  Neg Rubella:   Immune RPR:   NR HBsAg:   Neg HIV:   Neg GBS:   POS 1 hr GTT: 120 Genetic screen: 1st trimester and quad negative  Medical / Surgical History :  Past medical history:  Past Medical History  Diagnosis Date  . Kidney calculi   . Traumatic injury during pregnancy in third trimester 06/19/2015     Past surgical history:  Past Surgical History  Procedure Laterality Date  . Mouth surgery      Family History:  Family History  Problem Relation Age of Onset  . Hypertension Mother   . Kidney disease Father   . Cancer Maternal Grandmother   . Diabetes Maternal Grandmother   . Kidney disease Maternal Grandfather   . Heart disease Maternal Grandfather   . Hypertension Paternal Grandmother   . Heart disease Paternal Grandmother   . Kidney disease Paternal Grandfather   . Heart disease Paternal Grandfather      Social History:  reports that she has never smoked. She does not have any smokeless tobacco history on file. She reports that she does not drink alcohol or use illicit drugs.  Allergies: Review of patient's allergies indicates no known allergies.   Current Medications at time of admission:  Prior to Admission medications   Medication Sig Start Date End Date Taking? Authorizing Provider  calcium carbonate (TUMS - DOSED IN MG ELEMENTAL CALCIUM) 500 MG chewable tablet Chew 1-3 tablets  by mouth daily as needed for indigestion or heartburn.    Historical Provider, MD  IRON PO Take 15 mLs by mouth daily.    Historical Provider, MD  Prenatal Vit-Fe Fumarate-FA (PRENATAL MULTIVITAMIN) TABS tablet Take 1 tablet by mouth daily at 12 noon.    Historical Provider, MD     Review of Systems: +ctx since last night q3-5, mild +FM No LOF No bloody show  Physical Exam:  VS: 127/85, 93, 20, 97.9  General: alert and oriented, appears comfortable Heart: RRR Lungs: Clear lung fields Abdomen: Gravid, soft and non-tender, non-distended / uterus: gravid, non-tender Extremities: No edema Genitalia / VE:  deferred FHR: baseline rate 140 / variability mod / accelerations + / no decelerations TOCO: 1-3  Assessment: 36.[redacted] weeks gestation Labor: early active FHR category I GBS: pos  Plan:  Admit for labor-risk for precipitous delivery at home and inadequate GBS coverage. GBS prophylaxis, consider AROM, anticipate SVD. Dr. Ernestina PennaFogleman notified of admission / plan of care   Lawernce PittsBHAMBRI, [place] Spainhower, N MSN, CNM 07/17/2015, 10:03 AM

## 2015-07-17 NOTE — Progress Notes (Signed)
Called to see pt regarding variable decels  Subjective:   Feeling ctx more, not painful yet  Objective:   VS: Blood pressure 124/78, pulse 79, temperature 98.5 F (36.9 C), temperature source Oral, resp. rate 20. FHR: baseline 140 / variability mod / accelerations + / variable decelerations Toco: contractions every 2-4 minutes Cervix: 8/0 per RN exam  Assessment:  Labor: active FHR category previously Cat II now Cat I  Plan:  Variables likely from cord compression, continue Amnioinfusion, reposition as needed, watch closely, anticipated SVD. Dr. Juliene PinaMody updated with A/P.     Alison Turner, Alison Turner, N MSN, CNM 07/17/2015, 4:01 PM

## 2015-07-17 NOTE — Progress Notes (Signed)
Subjective:   Comfortable, slept for a bit, irregular ctx.  Objective:   VS: Blood pressure 121/68, pulse 94, temperature 98.5 F (36.9 C), temperature source Oral, resp. rate 20. FHR: baseline 140 / variability mod / accelerations + / no decelerations Toco: contractions irregular Cervix: Dilation: 7 Effacement (%): 90 Station: 0 Exam by:: Abriella Filkins Membranes: AROM-clear, abundant  Assessment:  Labor: early active-protracted FHR category I GBS positive  Plan:  Continue GBS prophylaxis, analgesia/anesthesia prn, anticipate SVD.     Donette LarryBHAMBRI, Armie Moren, N MSN, CNM 07/17/2015, 2:38 PM

## 2015-07-18 LAB — CBC
HCT: 24.5 % — ABNORMAL LOW (ref 36.0–46.0)
HEMOGLOBIN: 8.3 g/dL — AB (ref 12.0–15.0)
MCH: 31.7 pg (ref 26.0–34.0)
MCHC: 33.9 g/dL (ref 30.0–36.0)
MCV: 93.5 fL (ref 78.0–100.0)
Platelets: 171 10*3/uL (ref 150–400)
RBC: 2.62 MIL/uL — ABNORMAL LOW (ref 3.87–5.11)
RDW: 14 % (ref 11.5–15.5)
WBC: 10.2 10*3/uL (ref 4.0–10.5)

## 2015-07-18 LAB — CCBB MATERNAL DONOR DRAW

## 2015-07-18 LAB — RPR: RPR: NONREACTIVE

## 2015-07-18 NOTE — Lactation Note (Signed)
This note was copied from the chart of Alison Turner. Lactation Consultation Note New mom has 5.9 lb baby who is sleepy. Mom has large breast w/flat nipples. Areolas are compressible, but 36 weaker doesn't want to hold nipple in mouth long. I fitted mom w/#16 NS.  Mom used DEBP that RN set up for mom and pumped 30ml colostrum. Mom knows to pump q3h for 15-20 min.Mom was giving colostrum in curve tip syring. Baby is very sleepy and not taking in colostrum. Wouldn't BF. Parents has LPI information sheet. I attempted suck training and using curve tip syring  And gloved finger to stimulate suck training but baby didn't want to eat. Mom stated baby said had been spitty. Mom could only get colostrum from Rt. Breast during pumping. Couldn't understand why the Lt. Breast didn't pump anything. The flange had slipped up off of nipple, and a swollen are above nipple was noted. Discussed feeding plan, and supplementing. Mom encouraged to feed baby 8-12 times/24 hours and with feeding cues. Mom encouraged to waken baby for feeds. Referred to Baby and Me Book in Breastfeeding section Pg. 22-23 for position options and Proper latch demonstration.Referred to Baby and Me Book in Breastfeeding section Pg. 22-23 for position options and Proper latch demonstration.[facility]/LC brochure given w/resources, support groups and LC services. Educated about newborn behavior, I&O, cluster feeding, supply and demand. Patient Name: Alison Turner Today's Date: 07/18/2015 Reason for consult: Initial assessment   Maternal Data Has patient been taught Hand Expression?: Yes Does the patient have breastfeeding experience prior to this delivery?: No  Feeding Feeding Type: Breast Milk  LATCH Score/Interventions Intervention(s): Breast compression;Breast massage  Intervention(s): Skin to skin;Hand expression;Alternate breast massage  Type of Nipple: Flat Intervention(s): Double electric pump;Shells  Comfort (Breast/Nipple): Soft /  non-tender     Intervention(s): Skin to skin;Position options;Support Pillows;Breastfeeding basics reviewed     Lactation Tools Discussed/Used Tools: Shells;Nipple Dorris CarnesShields;Pump Nipple shield size: 16 Shell Type: Inverted Breast pump type: Double-Electric Breast Pump   Consult Status Consult Status: Follow-up Date: 07/18/15 Follow-up type: In-patient    Charyl DancerCARVER, [name] 07/18/2015, 4:59 AM

## 2015-07-18 NOTE — Progress Notes (Signed)
Patient ID: Alison Turner, female   DOB: 06-Feb-1991, 25 y.o.   MRN: [id number] PPD # 1 SVD  S:  Reports feeling "very sore in my bottom"             Tolerating po/ No nausea or vomiting             Bleeding is light             Pain controlled with ibuprofen (OTC)             Up ad lib / ambulatory / voiding without difficulties    Newborn  Information for the patient's newborn:  Alison Turner, Boy Alison Turner [[id number]][[id number]]  female  breast feeding  / Circumcision planning   O:  A & O x 3, in no apparent distress              VS:  Filed Vitals:   07/17/15 1930 07/17/15 2030 07/17/15 2150 07/18/15 0200  BP: 121/71 107/65 107/69 106/74  Pulse: 80 82 108 83  Temp:   98.3 F (36.8 C) 98.3 F (36.8 C)  TempSrc:  Axillary Oral Oral  Resp:  18 18 18   SpO2:  99% 98% 98%    LABS:  Recent Labs  07/17/15 1030 07/18/15 0825  WBC 8.8 10.2  HGB 10.4* 8.3*  HCT 30.3* 24.5*  PLT 185 171    Blood type: A POS (01/12 1030)  Rubella: Immune (01/12 1003)   I&O: I/O last 3 completed shifts: In: -  Out: 589 [Urine:356; Blood:233]             Lungs: Clear and unlabored  Heart: regular rate and rhythm / no murmurs  Abdomen: soft, non-tender, non-distended             Fundus: firm, non-tender, U-1  Perineum: bilateral labia repair healing well, moderate edema - ice pack in place  Lochia: minimal  Extremities: no edema, no calf pain or tenderness, no Homans    A/P: PPD # 1  25 y.o., G2P0010   Principal Problem:   Postpartum care following vaginal delivery (1/12) Active Problems:   Normal labor   Doing well - stable status  Routine post partum orders  Encouraged to take Percocet for pain  Continue ibuprofen  Keep ice pack on perineum today  Anticipate discharge tomorrow    Raelyn MoraAWSON, Timberlee Roblero, M, MSN, CNM 07/18/2015, 10:12 AM

## 2015-07-18 NOTE — Lactation Note (Signed)
This note was copied from the chart of Alison Turner [place]. Lactation Consultation Note  Patient Name: Alison Turner Szwed ZOXWR'UToday's Date: 07/18/2015 Reason for consult: Follow-up assessment   With this mom and baby. Dad was holding baby skin to skin while mom was pumping, and baby was showing feeidn cues. Mom latached baby independently and deep;ly, lots of breast movement, and colostrum seen around his lips. Mom knows to call for questions/concerns.    Maternal Data    Feeding Feeding Type: Breast Fed Nipple Type: Slow - flow Length of feed:  (started at 0930, still feeding)  LATCH Score/Interventions Latch: Grasps breast easily, tongue down, lips flanged, rhythmical sucking.  Audible Swallowing: Spontaneous and intermittent (colostrum seen around baby's lips)  Type of Nipple: Flat Intervention(s): Double electric pump  Comfort (Breast/Nipple): Soft / non-tender     Hold (Positioning): No assistance needed to correctly position infant at breast. Intervention(s): Breastfeeding basics reviewed;Support Pillows;Position options;Skin to skin  LATCH Score: 9  Lactation Tools Discussed/Used Nipple shield size:  (21)   Consult Status Consult Status: Follow-up Date: 07/19/15 Follow-up type: In-patient    Alfred LevinsLee, Joriel Streety Anne 07/18/2015, 9:38 AM

## 2015-07-18 NOTE — Lactation Note (Signed)
This note was copied from the chart of Boy Otilio CarpenSarah Behney. Lactation Consultation Note  Patient Name: Boy Otilio CarpenSarah Maranto WGNFA'OToday's Date: 07/18/2015 Reason for consult: Follow-up assessment   With this mom and LPI , now 14 hours old., and at 3.7 % weight loss.  The baby has not been fed in 5 hours, despite mom trying to feed him at 7 am  - baby sleepy. I suggested mom try a bottle to feed him, which is easier for him and saves his energy, and is easier for mom. He took 5 ml's of colostrum from the bottle, and it took him at least 15 minutes to get a rhythm and finish. He did have a large stool during the feeing, so this was part of his feeding delay!. Mom is pumping every 3 hours, and I showed her how to set initiation setting, and decreased her to 21 flanges with a better fit. Mom has abundant supply of colostrum. Parents very receptive to teaching.    Maternal Data    Feeding Feeding Type: Bottle Fed - Breast Milk Nipple Type: Slow - flow  LATCH Score/Interventions                      Lactation Tools Discussed/Used     Consult Status Consult Status: Follow-up Date: 07/19/15 Follow-up type: In-patient    Alfred LevinsLee, Ogechi Kuehnel Anne 07/18/2015, 9:16 AM

## 2015-07-19 ENCOUNTER — Encounter (HOSPITAL_COMMUNITY): Payer: Self-pay | Admitting: *Deleted

## 2015-07-19 ENCOUNTER — Ambulatory Visit: Payer: Self-pay

## 2015-07-19 DIAGNOSIS — O99019 Anemia complicating pregnancy, unspecified trimester: Secondary | ICD-10-CM

## 2015-07-19 DIAGNOSIS — D509 Iron deficiency anemia, unspecified: Secondary | ICD-10-CM | POA: Diagnosis present

## 2015-07-19 DIAGNOSIS — D62 Acute posthemorrhagic anemia: Secondary | ICD-10-CM | POA: Diagnosis not present

## 2015-07-19 MED ORDER — POLYSACCHARIDE IRON COMPLEX 150 MG PO CAPS: 150.0000 mg | ORAL_CAPSULE | Freq: Two times a day (BID) | ORAL | Status: DC

## 2015-07-19 MED ORDER — IBUPROFEN 600 MG PO TABS: 600.0000 mg | ORAL_TABLET | Freq: Four times a day (QID) | ORAL | Status: DC

## 2015-07-19 MED ORDER — MAGNESIUM 200 MG PO TABS
200.0000 mg | ORAL_TABLET | Freq: Every day | ORAL | Status: DC
  Administered 2015-07-19: 200 mg via ORAL
  Filled 2015-07-19 (×2): qty 1

## 2015-07-19 MED ORDER — MAGNESIUM 200 MG PO TABS: 200.0000 mg | ORAL_TABLET | Freq: Every day | ORAL | Status: DC

## 2015-07-19 MED ORDER — POLYSACCHARIDE IRON COMPLEX 150 MG PO CAPS
150.0000 mg | ORAL_CAPSULE | Freq: Two times a day (BID) | ORAL | Status: DC
  Administered 2015-07-19: 150 mg via ORAL
  Filled 2015-07-19: qty 1

## 2015-07-19 NOTE — Progress Notes (Signed)
PPD #2- SVD  Subjective:   Reports feeling well Tolerating po/ No nausea or vomiting Bleeding is moderate Pain controlled with Motrin Up ad lib / ambulatory / voiding without problems Newborn: breastfeeding  / Circumcision: planning  Objective:   VS: VS:  Filed Vitals:   07/18/15 0200 07/18/15 1000 07/18/15 1842 07/19/15 0540  BP: 106/74 110/72 112/75 101/63  Pulse: 83 78 95 84  Temp: 98.3 F (36.8 C) 98.2 F (36.8 C) 98.1 F (36.7 C) 97.5 F (36.4 C)  TempSrc: Oral Oral Oral Oral  Resp: 18 18 18 18   SpO2: 98%       LABS:  Recent Labs  07/17/15 1030 07/18/15 0825  WBC 8.8 10.2  HGB 10.4* 8.3*  PLT 185 171   Blood type: --/--/A POS, A POS (01/12 1030) Rubella: Immune (01/12 1003)                I&O: Intake/Output      01/13 0701 - 01/14 0700 01/14 0701 - 01/15 0700   Urine     Blood     Total Output       Net              Physical Exam: Alert and oriented X3 Abdomen: soft, non-tender, non-distended  Fundus: firm, non-tender, U-3 Perineum: Well approximated, no significant erythema, edema, or drainage; healing well. Lochia: small Extremities: No edema, no calf pain or tenderness   Assessment: PPD #2  G2P0111/ S/P:spontaneous vaginal, bilat labial lacerations IDA with compounding ABL anemia Doing well - stable for discharge home  Plan: Discharge home RX's:  Ibuprofen 600mg  po Q 6 hrs prn pain #30 Refill x 0 Niferex 150mg  po BID #60 Refill x 1 Magnesium Oxide 200 mg po daily #30, refill x1 [facility] booklet given    Donette LarryBHAMBRI, Aletha Allebach, N MSN, CNM 07/19/2015, 11:37 AM

## 2015-07-19 NOTE — Lactation Note (Signed)
This note was copied from the chart of Alison Turner [place]. Lactation Consultation Note  Patient Name: Alison Turner Isabell UJWJX'BToday's Date: 07/19/2015 Reason for consult: Follow-up assessment Baby at 52 hr of life and RN requested LC look at mom's nipples. Mom reported that she has been pumping for 15 minutes and after her nipples have small white blisters that pop and have clear fluid. LC did not see any blisters or skin break down, mom's entire nipple/areolar area looks pink. Had mom try a 24 mm flange then a 27 mm flange. The 27 mm looked a little big but mom stated that it felt better.  Mom is massage each breast for 20 minutes, then letting baby stay on each breast for 30 minutes, then massage again for 20 minutes, then pumping for 15 minutes. She is only offering her milk randomly when the baby will not latch. The new feeding plan is offer each breast 10-15 minutes, offer 20 ml of pumped milk, then mom is to pump for milk to offer the next feeding. Mom needs to use this feeding plan 8+/24hr. Mom was resistant to use the bottle for every supplementing because she thinks it is interfering with the latch. LC showed mom how to use the curved tip syring and the foley cup. Mom is aware of OP services and support group.    Maternal Data    Feeding Feeding Type: Breast Fed Length of feed: 15 min  LATCH Score/Interventions                      Lactation Tools Discussed/Used     Consult Status Consult Status: Follow-up Date: 07/20/15 Follow-up type: In-patient    Rulon Eisenmengerlizabeth E Ginamarie Banfield 07/19/2015, 10:52 PM

## 2015-07-19 NOTE — Lactation Note (Signed)
This note was copied from the chart of Alison Turner. Lactation Consultation Note  Patient Name: Alison Otilio CarpenSarah Kading ZOXWR'UToday's Date: 07/19/2015 Reason for consult: Follow-up assessment [facility] is 3940 [facility] & baby was alert with grandma. When back in room, mom reports that BF went really well the first day but that since yesterday baby has not really wanted to feed & that she did not get as much when she pumped last time. Mom reports that she has not been using the nipple shield lately because she did not feel comfortable using it. Mom BF on right breast in football hold without nipple shield- took a few attempts until he would stay latched and begin suckling. A few swallows were heard. Reinforced importance of compressing her breast as she would a sandwich so he can keep the nipple/ breast in his mouth. Baby stayed at the breast for ~20 mins when he came off and would not open his mouth again. Set mom up with pump & she pumped on initiate setting & got ~16 mL, which mom gave to baby after she finished pumping. Mom complained of some soreness on her left nipple when pumping so tried 24mm flanges- her nipples were not rubbing like they were with the 21mm flanges. Explained to mom that she may need to switch flange sizes depending on how it feels/ if her nipple changes. Mom stated she does not have any other milk for future feeds. Encouraged mom to pump again in ~1hr so that she has some milk to give with the next feeding & encouraged this feeding plan: pump for ~5 mins to stimulate her nipple & help with any fullness that prevents her breast to be compressable, then latch baby for at least 15 mins but no more than 30 mins & then to pump for ~15 mins while someone gives EBM from the previous pumping. Encouraged mom to repeat this at least every 3 hours or sooner with feeding cues. Mom reports no other questions at this  time.  Maternal Data    Feeding Feeding Type: Bottle Fed - Breast Milk Nipple Type: Slow - flow Length of feed: 20 min  LATCH Score/Interventions Latch: Repeated attempts needed to sustain latch, nipple held in mouth throughout feeding, stimulation needed to elicit sucking reflex. Intervention(s): Assist with latch;Breast compression  Audible Swallowing: A few with stimulation Intervention(s): Alternate breast massage  Type of Nipple: Flat Intervention(s): Double electric pump  Comfort (Breast/Nipple): Soft / non-tender     Hold (Positioning): No assistance needed to correctly position infant at breast.  LATCH Score: 7  Lactation Tools Discussed/Used     Consult Status Consult Status: Follow-up Date: 07/20/15 Follow-up type: In-patient    Oneal GroutLaura C Hubert Derstine 07/19/2015, 11:50 AM

## 2015-07-19 NOTE — Discharge Summary (Signed)
Obstetric Discharge Summary Reason for Admission: onset of labor and 36.[redacted] weeks gestation Prenatal Course: [facility], marginal CI with normal growth-EFW 5-1 (29%) at 35 wks, preterm dilation at 30 wks-1cm with slow progression to 4cm at 34 wks, Betamethasone x2 and booster, IDA.  Intrapartum Procedures: spontaneous vaginal delivery, GBS prophylaxis and AROM-clear Postpartum Procedures: none Complications-Operative and Postpartum: bilat labial laceration HEMOGLOBIN  Date Value Ref Range Status  07/18/2015 8.3* 12.0 - 15.0 g/dL Final    Comment:    REPEATED TO VERIFY DELTA CHECK NOTED    HCT  Date Value Ref Range Status  07/18/2015 24.5* 36.0 - 46.0 % Final    Physical Exam:  General: alert, cooperative and no distress Lochia: appropriate Uterine Fundus: firm Incision: healing well, no significant drainage, no dehiscence, no significant erythema DVT Evaluation: No evidence of DVT seen on physical exam. Negative Homan's sign. No cords or calf tenderness. No significant calf/ankle edema.  Discharge Diagnoses: Pre-term pregnancy, delivered-36.6 weeks  Discharge Information: Date: 07/19/2015 Activity: pelvic rest Diet: routine Medications: PNV, Ibuprofen, Iron and Magnesium Oxide Condition: stable Instructions: refer to practice specific booklet Discharge to: home Follow-up Information    Follow up with Shequita Peplinski, N, CNM. Schedule an appointment as soon as possible for a visit in 6 weeks.   Specialty:  Obstetrics and Gynecology   Contact information:   [address] [place] [id number] [phone]       Newborn Data: Live born female  Birth Weight: 5 lb 9.2 oz (2530 g) APGAR: 7, 8   Samadhi Mahurin, N 07/19/2015, 11:41 AM

## 2015-07-20 ENCOUNTER — Ambulatory Visit: Payer: Self-pay

## 2015-07-20 NOTE — Lactation Note (Signed)
This note was copied from the chart of Alison [name]. Lactation Consultation Note  Patient Name: Alison Turner ZOXWR'UToday's Date: 07/20/2015 Reason for consult: Follow-up assessment;Late preterm infant  LPI 2863 hours old. Mom nursing baby in cradle position when this LC entered the room. Assisted mom with cross-cradle position and she reported increased comfort. Parents about to supplement with syringe and finger. Assisted with 5 French feeding system in order to supplement at the breast, and baby took an additional 14 ml of EBM. Enc nursing with cues, and at least every 3 hours--waking the baby as needed. Enc keeping the total feeding time to 30 minutes--including supplementation. And Enc post-pumping right after baby fed. Reviewed EBM storage guidelines, and referred parents to Baby and Me booklet for number of diapers to expect by day of life. Discussed normal weight gain of infants. Parents aware of OP/BFSG and LC phone line assistance after D/C.  Mom reports that her breasts are starting to fill. Discussed engorgement prevention/treatment, and enc nursing on one breast at each feeding so baby will reach the hind milk. Mom has personal pump at home.  Maternal Data    Feeding Feeding Type: Breast Fed Length of feed: 30 min  LATCH Score/Interventions Latch: Grasps breast easily, tongue down, lips flanged, rhythmical sucking.  Audible Swallowing: Spontaneous and intermittent  Type of Nipple: Flat  Comfort (Breast/Nipple): Filling, red/small blisters or bruises, mild/mod discomfort  Problem noted: Filling Interventions (Filling): Double electric pump  Hold (Positioning): Assistance needed to correctly position infant at breast and maintain latch.  LATCH Score: 7  Lactation Tools Discussed/Used Tools: 83F feeding tube / Syringe   Consult Status Consult Status: PRN    Geralynn OchsWILLIARD, [name] 07/20/2015, 9:35 AM

## 2016-01-30 DIAGNOSIS — N390 Urinary tract infection, site not specified: Secondary | ICD-10-CM | POA: Insufficient documentation

## 2016-02-19 DIAGNOSIS — H6992 Unspecified Eustachian tube disorder, left ear: Secondary | ICD-10-CM | POA: Insufficient documentation

## 2016-02-19 DIAGNOSIS — H6982 Other specified disorders of Eustachian tube, left ear: Secondary | ICD-10-CM | POA: Insufficient documentation

## 2016-04-14 ENCOUNTER — Other Ambulatory Visit: Payer: Self-pay | Admitting: Physical Medicine and Rehabilitation

## 2016-05-05 ENCOUNTER — Telehealth: Payer: Self-pay | Admitting: Neurology

## 2016-05-05 ENCOUNTER — Encounter: Payer: Self-pay | Admitting: Neurology

## 2016-05-05 ENCOUNTER — Ambulatory Visit (INDEPENDENT_AMBULATORY_CARE_PROVIDER_SITE_OTHER): Payer: 59 | Admitting: Neurology

## 2016-05-05 VITALS — Ht 63.0 in | Wt 106.5 lb

## 2016-05-05 DIAGNOSIS — R202 Paresthesia of skin: Secondary | ICD-10-CM | POA: Diagnosis not present

## 2016-05-05 DIAGNOSIS — R29898 Other symptoms and signs involving the musculoskeletal system: Secondary | ICD-10-CM | POA: Diagnosis not present

## 2016-05-05 MED ORDER — ALPRAZOLAM 0.5 MG PO TABS: ORAL_TABLET | ORAL | 0 refills | Status: DC

## 2016-05-05 NOTE — Telephone Encounter (Signed)
Patient called to request change in type of MRI from larger MRI machine to open MRI.

## 2016-05-05 NOTE — Progress Notes (Signed)
Reason for visit: Arm weakness  Referring physician: Dr. Jannette Fogortmann  Alison Turner is a 25 y.o. female  History of present illness:  Alison Turner is a 25 year old left-handed white female with a 3 year history of bilateral forearm discomfort. The patient did not have any numbness until about 3 or 4 months prior to this evaluation. The patient has symptoms that are worse on the left arm than the right, but both arms are affected. The patient reports some numbness and tingling sensations in both hands. She feels weak in both hands and feels that the left hand in particular is clumsy. The patient does report some neck discomfort, but she denies any pain radiating down the arms to the hands. The patient has also noted onset of some numbness in the anterolateral aspect of the right leg below the knee. She denies any numbness on the body. She denies any significant changes in bowel or bladder control, she may have some stress incontinence of the bladder at times. She denies headaches, but he also has had some blurring of vision that has come on in the last several months. She denies any double vision. The patient has undergone nerve conduction studies on both arms and EMG on the right arm that were reportedly normal, done by Dr. Ethelene Halamos. I do not have the results of the actual testing. The patient had an episode on 02/23/2015 of some confusion, the patient was having difficulty understanding what was being said to her, she could not remember the names for things. MRI of the brain at that time was normal. The patient has had brief episodes of loss of memory since that time, but nothing recently. The patient is sent to this office for an evaluation.  Past Medical History:  Diagnosis Date  . Kidney calculi   . Traumatic injury during pregnancy in third trimester 06/19/2015    Past Surgical History:  Procedure Laterality Date  . MOUTH SURGERY      Family History  Problem Relation Age of Onset  .  Hypertension Mother   . Kidney disease Father   . Cancer Maternal Grandmother   . Diabetes Maternal Grandmother   . Kidney disease Maternal Grandfather   . Heart disease Maternal Grandfather   . Hypertension Paternal Grandmother   . Heart disease Paternal Grandmother   . Kidney disease Paternal Grandfather   . Heart disease Paternal Grandfather     Social history:  reports that she has never smoked. She has never used smokeless tobacco. She reports that she does not drink alcohol or use drugs.  Medications:  Prior to Admission medications   Medication Sig Start Date End Date Taking? Authorizing Provider  fluticasone (FLONASE) 50 MCG/ACT nasal spray  02/19/16  Yes Historical Provider, MD     No Known Allergies  ROS:  Out of a complete 14 system review of symptoms, the patient complains only of the following symptoms, and all other reviewed systems are negative.  Fatigue Muffled hearing Blurred vision Blood in the stool Achy muscles Confusion, numbness, weakness, dizziness Anxiety, not enough sleep, decreased energy Restless legs  Height 5\' 3"  (1.6 m), weight 106 lb 8 oz (48.3 kg), unknown if currently breastfeeding.  Physical Exam  General: The patient is alert and cooperative at the time of the examination.  Eyes: Pupils are equal, round, and reactive to light. Discs are flat bilaterally.  Neck: The neck is supple, no carotid bruits are noted.  Respiratory: The respiratory examination is clear.  Cardiovascular:  The cardiovascular examination reveals a regular rate and rhythm, no obvious murmurs or rubs are noted.  Neuromuscular: Range of movement of the cervical spine and lumbar spine is full.  Skin: Extremities are without significant edema.  Neurologic Exam  Mental status: The patient is alert and oriented x 3 at the time of the examination. The patient has apparent normal recent and remote memory, with an apparently normal attention span and concentration  ability.  Cranial nerves: Facial symmetry is present. There is good sensation of the face to pinprick and soft touch bilaterally. The strength of the facial muscles and the muscles to head turning and shoulder shrug are normal bilaterally. Speech is well enunciated, no aphasia or dysarthria is noted. Extraocular movements are full. Visual fields are full. The tongue is midline, and the patient has symmetric elevation of the soft palate. No obvious hearing deficits are noted.  Motor: The motor testing reveals 5 over 5 strength of all 4 extremities. Good symmetric motor tone is noted throughout.  Sensory: Sensory testing is intact to pinprick, soft touch, vibration sensation, and position sense on all 4 extremities. No evidence of extinction is noted.  Coordination: Cerebellar testing reveals good finger-nose-finger and heel-to-shin bilaterally.  Gait and station: Gait is normal. Tandem gait is normal. Romberg is negative. No drift is seen.  Reflexes: Deep tendon reflexes are symmetric and normal bilaterally. Toes are downgoing bilaterally.   MRI brain 02/23/15:   IMPRESSION: Normal MRI of the brain  Normal MRV of the brain.  * MRI scan images were reviewed online. I agree with the written report.   Assessment/Plan:  1. Reported numbness, weakness both arms  The clinical examination today is objectively normal. The patient reports an underlying history of anxiety problems. The patient has had persistent symptoms in the arms over the last 3 or 4 months, some numbness of the right leg as well. Nerve conduction study and EMG evaluation apparently was unremarkable. The patient will be set up for MRI of the brain, MRI of the cervical spine, and blood work today. I will contact her concerning the results of the above evaluation.  Alison Palau. Keith Starlene Consuegra MD 05/05/2016 8:42 AM  [facility] [address] [place] [id number]  Phone [phone] Fax  [phone]

## 2016-05-06 LAB — COMPREHENSIVE METABOLIC PANEL
A/G RATIO: 2 (ref 1.2–2.2)
ALT: 9 IU/L (ref 0–32)
AST: 15 IU/L (ref 0–40)
Albumin: 4.8 g/dL (ref 3.5–5.5)
Alkaline Phosphatase: 103 IU/L (ref 39–117)
BUN/Creatinine Ratio: 25 — ABNORMAL HIGH (ref 9–23)
BUN: 15 mg/dL (ref 6–20)
Bilirubin Total: <0.2 mg/dL (ref 0.0–1.2)
CALCIUM: 9.6 mg/dL (ref 8.7–10.2)
CHLORIDE: 107 mmol/L — AB (ref 96–106)
CO2: 25 mmol/L (ref 18–29)
Creatinine, Ser: 0.61 mg/dL (ref 0.57–1.00)
GFR calc Af Amer: 146 mL/min/{1.73_m2} (ref >59)
GFR, EST NON AFRICAN AMERICAN: 126 mL/min/{1.73_m2} (ref >59)
GLUCOSE: 89 mg/dL (ref 65–99)
Globulin, Total: 2.4 g/dL (ref 1.5–4.5)
POTASSIUM: 5.3 mmol/L — AB (ref 3.5–5.2)
Sodium: 146 mmol/L — ABNORMAL HIGH (ref 134–144)
Total Protein: 7.2 g/dL (ref 6.0–8.5)

## 2016-05-06 LAB — RHEUMATOID FACTOR: Rhuematoid fact SerPl-aCnc: <10 IU/mL (ref 0.0–13.9)

## 2016-05-06 LAB — SEDIMENTATION RATE: SED RATE: 6 mm/h (ref 0–32)

## 2016-05-06 LAB — B. BURGDORFI ANTIBODIES: Lyme IgG/IgM Ab: <0.91 {ISR} (ref 0.00–0.90)

## 2016-05-06 LAB — ANGIOTENSIN CONVERTING ENZYME: Angio Convert Enzyme: 68 U/L (ref 14–82)

## 2016-05-06 LAB — RPR: RPR Ser Ql: NONREACTIVE

## 2016-05-06 LAB — VITAMIN B12: VITAMIN B 12: 1293 pg/mL — AB (ref 211–946)

## 2016-05-06 LAB — ANA W/REFLEX: ANA: NEGATIVE

## 2016-05-07 ENCOUNTER — Telehealth: Payer: Self-pay

## 2016-05-07 NOTE — Telephone Encounter (Signed)
-----   Message from York Spanielharles K Willis, MD sent at 05/06/2016  6:08 PM EDT -----  The blood work results are unremarkable, with exception of a minimal elevation the sodium, chloride, and potassium levels, not likely to be clinically significant. Please call the patient. ----- Message ----- From: Nell RangeInterface, [facility] Lab Results In Sent: 05/06/2016   7:42 AM To: York Spanielharles K Willis, MD

## 2016-05-07 NOTE — Telephone Encounter (Signed)
Called pt w/ lab results. Verbalized understanding and appreciation for call. 

## 2016-05-07 NOTE — Telephone Encounter (Signed)
-----   Message from Charles K Willis, MD sent at 05/06/2016  6:08 PM EDT -----  The blood work results are unremarkable, with exception of a minimal elevation the sodium, chloride, and potassium levels, not likely to be clinically significant. Please call the patient. ----- Message ----- From: Interface, [facility] Lab Results In Sent: 05/06/2016   7:42 AM To: Charles K Willis, MD    

## 2016-05-11 NOTE — Telephone Encounter (Signed)
Pt called to schedule open MRI.

## 2016-05-17 NOTE — Telephone Encounter (Signed)
Pt called in to schedule MRI. Please call

## 2016-05-18 ENCOUNTER — Telehealth: Payer: Self-pay | Admitting: Neurology

## 2016-05-18 NOTE — Telephone Encounter (Signed)
Called Alison Turner back and informed her that one of the MRi's was [facility] but the second one is pending.. I informed him as soon as I find something out by Dr. Anne HahnWillis I will contact her and that since she wants a open MRI I will fax the order to [facility].

## 2016-05-18 NOTE — Telephone Encounter (Signed)
Gerome ApleyEmily E. Is working on authorizations for this patient MRI. Will call when finished.

## 2016-05-18 NOTE — Telephone Encounter (Signed)
The MRI Brain w/wo contrast [facility] but the MR Cervical spine w/wo contrast was not approved. How would you like to proceed with this? A peer to peer phone number is [phone] select option 3 and the case number is [id number].

## 2016-05-18 NOTE — Telephone Encounter (Signed)
I called the insurance company the MRI the cervical spine has been approved, the approval number is CC [id number].

## 2016-05-19 NOTE — Telephone Encounter (Signed)
Noted, thank you

## 2016-05-19 NOTE — Telephone Encounter (Signed)
I spoke with the patient and informed her that both of the studies were approved and that I faxed the order to [facility] I also gave her their phone number [phone]. And if she doesn't hear from them to give them a call.

## 2016-05-25 DIAGNOSIS — R29898 Other symptoms and signs involving the musculoskeletal system: Secondary | ICD-10-CM | POA: Diagnosis not present

## 2016-05-31 ENCOUNTER — Telehealth: Payer: Self-pay | Admitting: Neurology

## 2016-05-31 NOTE — Telephone Encounter (Signed)
I called the patient. I have not yet received the reports of the MRI the brain and cervical spine, I will call the patient as soon as I get the report.

## 2016-05-31 NOTE — Telephone Encounter (Signed)
Patient is calling to get MRI results. °

## 2016-06-01 ENCOUNTER — Ambulatory Visit (INDEPENDENT_AMBULATORY_CARE_PROVIDER_SITE_OTHER): Payer: Self-pay

## 2016-06-01 DIAGNOSIS — R202 Paresthesia of skin: Secondary | ICD-10-CM

## 2016-06-01 DIAGNOSIS — Z0289 Encounter for other administrative examinations: Secondary | ICD-10-CM

## 2016-06-01 DIAGNOSIS — R29898 Other symptoms and signs involving the musculoskeletal system: Secondary | ICD-10-CM

## 2016-06-02 ENCOUNTER — Telehealth: Payer: Self-pay | Admitting: Neurology

## 2016-06-02 NOTE — Telephone Encounter (Signed)
I called the patient, talk with the husband. MRI the brain and cervical spine were unremarkable, blood work was unremarkable, the patient has had EMG and nerve conduction study previously that was unremarkable.  The sensory issues are likely benign, if the patient desires, gabapentin can be used if the symptoms are uncomfortable.  The patient is to contact our office if she desires medication.

## 2017-03-18 ENCOUNTER — Encounter (HOSPITAL_COMMUNITY): Payer: Self-pay | Admitting: *Deleted

## 2017-03-18 ENCOUNTER — Inpatient Hospital Stay (HOSPITAL_COMMUNITY)
Admission: AD | Admit: 2017-03-18 | Discharge: 2017-03-18 | Disposition: A | Discharge status: Home Health | Payer: 59 | Source: Ambulatory Visit | Attending: Obstetrics and Gynecology | Admitting: Obstetrics and Gynecology

## 2017-03-18 ENCOUNTER — Inpatient Hospital Stay (HOSPITAL_COMMUNITY): Payer: 59

## 2017-03-18 DIAGNOSIS — R109 Unspecified abdominal pain: Secondary | ICD-10-CM | POA: Diagnosis not present

## 2017-03-18 DIAGNOSIS — Z833 Family history of diabetes mellitus: Secondary | ICD-10-CM | POA: Diagnosis not present

## 2017-03-18 DIAGNOSIS — Z87442 Personal history of urinary calculi: Secondary | ICD-10-CM | POA: Insufficient documentation

## 2017-03-18 DIAGNOSIS — Z841 Family history of disorders of kidney and ureter: Secondary | ICD-10-CM | POA: Insufficient documentation

## 2017-03-18 DIAGNOSIS — Z809 Family history of malignant neoplasm, unspecified: Secondary | ICD-10-CM | POA: Diagnosis not present

## 2017-03-18 DIAGNOSIS — Z9889 Other specified postprocedural states: Secondary | ICD-10-CM | POA: Insufficient documentation

## 2017-03-18 DIAGNOSIS — Z3201 Encounter for pregnancy test, result positive: Secondary | ICD-10-CM | POA: Insufficient documentation

## 2017-03-18 DIAGNOSIS — Z87828 Personal history of other (healed) physical injury and trauma: Secondary | ICD-10-CM | POA: Diagnosis not present

## 2017-03-18 DIAGNOSIS — Z3A01 Less than 8 weeks gestation of pregnancy: Secondary | ICD-10-CM | POA: Insufficient documentation

## 2017-03-18 DIAGNOSIS — Z3491 Encounter for supervision of normal pregnancy, unspecified, first trimester: Secondary | ICD-10-CM

## 2017-03-18 DIAGNOSIS — O26891 Other specified pregnancy related conditions, first trimester: Secondary | ICD-10-CM | POA: Diagnosis not present

## 2017-03-18 DIAGNOSIS — Z8249 Family history of ischemic heart disease and other diseases of the circulatory system: Secondary | ICD-10-CM | POA: Insufficient documentation

## 2017-03-18 DIAGNOSIS — O209 Hemorrhage in early pregnancy, unspecified: Secondary | ICD-10-CM | POA: Insufficient documentation

## 2017-03-18 LAB — URINALYSIS, ROUTINE W REFLEX MICROSCOPIC
BILIRUBIN URINE: NEGATIVE
Glucose, UA: NEGATIVE mg/dL
Ketones, ur: NEGATIVE mg/dL
LEUKOCYTES UA: NEGATIVE
Nitrite: NEGATIVE
Protein, ur: NEGATIVE mg/dL
SPECIFIC GRAVITY, URINE: 1.031 — AB (ref 1.005–1.030)
pH: 5 (ref 5.0–8.0)

## 2017-03-18 LAB — CBC
HCT: 35.4 % — ABNORMAL LOW (ref 36.0–46.0)
HEMOGLOBIN: 12.3 g/dL (ref 12.0–15.0)
MCH: 31.5 pg (ref 26.0–34.0)
MCHC: 34.7 g/dL (ref 30.0–36.0)
MCV: 90.5 fL (ref 78.0–100.0)
Platelets: 254 10*3/uL (ref 150–400)
RBC: 3.91 MIL/uL (ref 3.87–5.11)
RDW: 12.1 % (ref 11.5–15.5)
WBC: 5.7 10*3/uL (ref 4.0–10.5)

## 2017-03-18 LAB — HCG, QUANTITATIVE, PREGNANCY: HCG, BETA CHAIN, QUANT, S: 1229 m[IU]/mL — AB (ref <5)

## 2017-03-18 LAB — POCT PREGNANCY, URINE: PREG TEST UR: POSITIVE — AB

## 2017-03-18 NOTE — Discharge Instructions (Signed)

## 2017-03-18 NOTE — Progress Notes (Signed)
Pt discharged from MAU with significant other. Ambulatory. Discharge instructions given. No prescriptions. No questions. No acute distress. Denies pain.

## 2017-03-18 NOTE — MAU Note (Signed)
+  vaginal bleeding x4 days Few small clots Bright red in color  LMP 02/05/17  Lower abdominal cramping and sharp at times Rating pain 1/10  +light headed

## 2017-03-18 NOTE — MAU Provider Note (Signed)
History     CSN: [id number]  Arrival date and time: 03/18/17 4098   First Provider Initiated Contact with Patient 03/18/17 1926      Chief Complaint  Patient presents with  . Vaginal Bleeding  . Abdominal Pain   HPI Alison Turner is a 26 y.o. [phone] at [redacted]w[redacted]d by LMP who presents with vaginal bleeding and abdominal pain. Vaginal bleeding since Monday. Started out as spotting and has increased today. Is not saturating pads or passing clots. BHCGs this week, doubled on Wednesday but only increased by 100 today.  Lower abdominal cramping all week that has increased some today. Rates pain 1/10. Has not treated.   OB History    Gravida Para Term Preterm AB Living   SAB TAB Ectopic Multiple Live Births   1     0 1      Past Medical History:  Diagnosis Date  . Kidney calculi   . Traumatic injury during pregnancy in third trimester 06/19/2015    Past Surgical History:  Procedure Laterality Date  . MOUTH SURGERY      Family History  Problem Relation Age of Onset  . Hypertension Mother   . Kidney disease Father   . Cancer Maternal Grandmother   . Diabetes Maternal Grandmother   . Kidney disease Maternal Grandfather   . Heart disease Maternal Grandfather   . Hypertension Paternal Grandmother   . Heart disease Paternal Grandmother   . Kidney disease Paternal Grandfather   . Heart disease Paternal Grandfather   . Healthy Sister   . Healthy Brother   . Healthy Sister   . Healthy Sister   . Healthy Sister   . Healthy Brother   . Healthy Brother     Social History  Substance Use Topics  . Smoking status: Never Smoker  . Smokeless tobacco: Never Used  . Alcohol use No     Comment: Occasionally    Allergies: No Known Allergies  Prescriptions Prior to Admission  Medication Sig Dispense Refill Last Dose  . progesterone 200 MG SUPP Place 200 mg vaginally at bedtime.   03/17/2017 at Unknown time  . ALPRAZolam (XANAX) 0.5 MG tablet Take 2 tablets  approximately 45 minutes prior to the MRI study, take a third tablet if needed. 3 tablet 0   . fluticasone (FLONASE) 50 MCG/ACT nasal spray    Taking    Review of Systems  Constitutional: Negative.   Gastrointestinal: Positive for abdominal pain.  Genitourinary: Positive for vaginal bleeding.   Physical Exam   Blood pressure 106/72, pulse 84, temperature 97.8 F (36.6 C), temperature source Oral, resp. rate 16, weight 104 lb 1.3 oz (47.2 kg), last menstrual period 02/05/2017, SpO2 100 %, unknown if currently breastfeeding.  Physical Exam  Nursing note and vitals reviewed. Constitutional: She is oriented to person, place, and time. She appears well-developed and well-nourished. No distress.  HENT:  Head: Normocephalic and atraumatic.  Eyes: Conjunctivae are normal. Right eye exhibits no discharge. Left eye exhibits no discharge. No scleral icterus.  Neck: Normal range of motion.  Respiratory: Effort normal. No respiratory distress.  GI: Soft. She exhibits no distension. There is no tenderness. There is no rebound and no guarding.  Genitourinary: Uterus normal. Cervix exhibits no motion tenderness and no friability. Right adnexum displays no mass and no tenderness. Left adnexum displays no mass and no tenderness. No bleeding in the vagina. Vaginal discharge (small amount of tan discharge) found.  Genitourinary Comments: Cervix closed  Neurological: She is alert and oriented to person, place, and time.  Skin: Skin is warm and dry. She is not diaphoretic.  Psychiatric: She has a normal mood and affect. Her behavior is normal. Judgment and thought content normal.    MAU Course  Procedures Results for orders placed or performed during the hospital encounter of 03/18/17 (from the past 24 hour(s))  Urinalysis, Routine w reflex microscopic     Status: Abnormal   Collection Time: 03/18/17  6:59 PM  Result Value Ref Range   Color, Urine YELLOW YELLOW   APPearance CLEAR CLEAR   Specific  Gravity, Urine 1.031 (H) 1.005 - 1.030   pH 5.0 5.0 - 8.0   Glucose, UA NEGATIVE NEGATIVE mg/dL   Hgb urine dipstick SMALL (A) NEGATIVE   Bilirubin Urine NEGATIVE NEGATIVE   Ketones, ur NEGATIVE NEGATIVE mg/dL   Protein, ur NEGATIVE NEGATIVE mg/dL   Nitrite NEGATIVE NEGATIVE   Leukocytes, UA NEGATIVE NEGATIVE   RBC / HPF 0-5 0 - 5 RBC/hpf   WBC, UA 0-5 0 - 5 WBC/hpf   Bacteria, UA RARE (A) NONE SEEN   Squamous Epithelial / LPF 0-5 (A) NONE SEEN   Mucus PRESENT    Ca Oxalate Crys, UA PRESENT   Pregnancy, urine POC     Status: Abnormal   Collection Time: 03/18/17  7:11 PM  Result Value Ref Range   Preg Test, Ur POSITIVE (A) NEGATIVE  CBC     Status: Abnormal   Collection Time: 03/18/17  7:28 PM  Result Value Ref Range   WBC 5.7 4.0 - 10.5 K/uL   RBC 3.91 3.87 - 5.11 MIL/uL   Hemoglobin 12.3 12.0 - 15.0 g/dL   HCT 16.1 (L) 09.6 - 04.5 %   MCV 90.5 78.0 - 100.0 fL   MCH 31.5 26.0 - 34.0 pg   MCHC 34.7 30.0 - 36.0 g/dL   RDW 40.9 81.1 - 91.4 %   Platelets 254 150 - 400 K/uL  hCG, quantitative, pregnancy     Status: Abnormal   Collection Time: 03/18/17  7:28 PM  Result Value Ref Range   hCG, Beta Chain, Quant, S 1,229 (H) <5 mIU/mL    MDM A positive CBC, BHCG, ultrasound ordered HCG today 1229 Hgb 12.3 Ultrasound shows SIUGS with yolk sac Discussed with Dr. Waldo Laine to discharge home. Patient has f/u in office on Monday.  Assessment and Plan  A: 1. Vaginal bleeding in pregnancy, first trimester   2. Normal IUP (intrauterine pregnancy) on prenatal ultrasound, first trimester   3. Less than [redacted] weeks gestation of pregnancy    P: Discharge home Pelvic rest Bleeding/miscarriage precautions discussed Keep f/u with OB on Monday  Judeth Horn 03/18/2017, 7:27 PM

## 2017-06-29 LAB — OB RESULTS CONSOLE HIV ANTIBODY (ROUTINE TESTING): HIV: NONREACTIVE

## 2017-06-29 LAB — OB RESULTS CONSOLE GC/CHLAMYDIA
Chlamydia: NEGATIVE
GC PROBE AMP, GENITAL: NEGATIVE

## 2017-06-29 LAB — OB RESULTS CONSOLE RPR: RPR: NONREACTIVE

## 2017-06-29 LAB — OB RESULTS CONSOLE ABO/RH: RH Type: POSITIVE

## 2017-06-29 LAB — OB RESULTS CONSOLE ANTIBODY SCREEN: ANTIBODY SCREEN: NEGATIVE

## 2017-06-29 LAB — OB RESULTS CONSOLE HEPATITIS B SURFACE ANTIGEN: HEP B S AG: NEGATIVE

## 2017-06-29 LAB — OB RESULTS CONSOLE RUBELLA ANTIBODY, IGM: RUBELLA: IMMUNE

## 2017-07-05 NOTE — L&D Delivery Note (Signed)
Delivery Note At 1:59 AM a viable female was delivered via  (Presentation: OA ; square shoulders ).  APGAR:8 ,9 ; weight pending .   Placenta status: partial separation with brisk bleed noted, placenta delivered [place], LUS massage and modified Francee PiccoloBrandt . C/I uterus massaged firm, intermittent uterine atony noted, Pitocin IV and Methergine IM given, good hemostasis noted thereafter  Cord:  3VC with the following complications: thin.  Cord blood collected for typing, placenta to patient per request.  Anesthesia: lido 1% Episiotomy:  none Lacerations:  R periclitoral Suture Repair: vicryl 4.0 Est. Blood Loss (mL): 500   Mom to postpartum.  Baby to Couplet care / Skin to Skin.  Neta MendsDaniela C Alfa Leibensperger, CNM 01/13/2018, 2:53 AM

## 2017-07-08 DIAGNOSIS — B373 Candidiasis of vulva and vagina: Secondary | ICD-10-CM | POA: Diagnosis not present

## 2017-07-18 DIAGNOSIS — O09291 Supervision of pregnancy with other poor reproductive or obstetric history, first trimester: Secondary | ICD-10-CM | POA: Diagnosis not present

## 2017-07-18 DIAGNOSIS — Z3A13 13 weeks gestation of pregnancy: Secondary | ICD-10-CM | POA: Diagnosis not present

## 2017-07-18 DIAGNOSIS — O09299 Supervision of pregnancy with other poor reproductive or obstetric history, unspecified trimester: Secondary | ICD-10-CM | POA: Diagnosis not present

## 2017-08-08 DIAGNOSIS — Z361 Encounter for antenatal screening for raised alphafetoprotein level: Secondary | ICD-10-CM | POA: Diagnosis not present

## 2017-08-08 DIAGNOSIS — O09292 Supervision of pregnancy with other poor reproductive or obstetric history, second trimester: Secondary | ICD-10-CM | POA: Diagnosis not present

## 2017-08-08 DIAGNOSIS — Z3A16 16 weeks gestation of pregnancy: Secondary | ICD-10-CM | POA: Diagnosis not present

## 2017-08-25 DIAGNOSIS — Z363 Encounter for antenatal screening for malformations: Secondary | ICD-10-CM | POA: Diagnosis not present

## 2017-08-25 DIAGNOSIS — O09292 Supervision of pregnancy with other poor reproductive or obstetric history, second trimester: Secondary | ICD-10-CM | POA: Diagnosis not present

## 2017-08-25 DIAGNOSIS — Z3A18 18 weeks gestation of pregnancy: Secondary | ICD-10-CM | POA: Diagnosis not present

## 2017-08-25 DIAGNOSIS — Z3686 Encounter for antenatal screening for cervical length: Secondary | ICD-10-CM | POA: Diagnosis not present

## 2017-09-19 DIAGNOSIS — O09292 Supervision of pregnancy with other poor reproductive or obstetric history, second trimester: Secondary | ICD-10-CM | POA: Diagnosis not present

## 2017-09-19 DIAGNOSIS — Z3A22 22 weeks gestation of pregnancy: Secondary | ICD-10-CM | POA: Diagnosis not present

## 2017-10-14 DIAGNOSIS — Z3A25 25 weeks gestation of pregnancy: Secondary | ICD-10-CM | POA: Diagnosis not present

## 2017-10-14 DIAGNOSIS — O09292 Supervision of pregnancy with other poor reproductive or obstetric history, second trimester: Secondary | ICD-10-CM | POA: Diagnosis not present

## 2017-10-25 DIAGNOSIS — M9904 Segmental and somatic dysfunction of sacral region: Secondary | ICD-10-CM | POA: Diagnosis not present

## 2017-10-25 DIAGNOSIS — M545 Low back pain: Secondary | ICD-10-CM | POA: Diagnosis not present

## 2017-10-25 DIAGNOSIS — M9903 Segmental and somatic dysfunction of lumbar region: Secondary | ICD-10-CM | POA: Diagnosis not present

## 2017-10-25 DIAGNOSIS — M9906 Segmental and somatic dysfunction of lower extremity: Secondary | ICD-10-CM | POA: Diagnosis not present

## 2017-10-27 DIAGNOSIS — M545 Low back pain: Secondary | ICD-10-CM | POA: Diagnosis not present

## 2017-10-27 DIAGNOSIS — M9904 Segmental and somatic dysfunction of sacral region: Secondary | ICD-10-CM | POA: Diagnosis not present

## 2017-10-27 DIAGNOSIS — M9906 Segmental and somatic dysfunction of lower extremity: Secondary | ICD-10-CM | POA: Diagnosis not present

## 2017-10-27 DIAGNOSIS — M9903 Segmental and somatic dysfunction of lumbar region: Secondary | ICD-10-CM | POA: Diagnosis not present

## 2017-10-31 DIAGNOSIS — M9903 Segmental and somatic dysfunction of lumbar region: Secondary | ICD-10-CM | POA: Diagnosis not present

## 2017-10-31 DIAGNOSIS — Z3689 Encounter for other specified antenatal screening: Secondary | ICD-10-CM | POA: Diagnosis not present

## 2017-10-31 DIAGNOSIS — O09292 Supervision of pregnancy with other poor reproductive or obstetric history, second trimester: Secondary | ICD-10-CM | POA: Diagnosis not present

## 2017-10-31 DIAGNOSIS — Z3A28 28 weeks gestation of pregnancy: Secondary | ICD-10-CM | POA: Diagnosis not present

## 2017-10-31 DIAGNOSIS — M545 Low back pain: Secondary | ICD-10-CM | POA: Diagnosis not present

## 2017-10-31 DIAGNOSIS — M9904 Segmental and somatic dysfunction of sacral region: Secondary | ICD-10-CM | POA: Diagnosis not present

## 2017-10-31 DIAGNOSIS — M9906 Segmental and somatic dysfunction of lower extremity: Secondary | ICD-10-CM | POA: Diagnosis not present

## 2017-11-02 DIAGNOSIS — M545 Low back pain: Secondary | ICD-10-CM | POA: Diagnosis not present

## 2017-11-02 DIAGNOSIS — M9903 Segmental and somatic dysfunction of lumbar region: Secondary | ICD-10-CM | POA: Diagnosis not present

## 2017-11-02 DIAGNOSIS — M9904 Segmental and somatic dysfunction of sacral region: Secondary | ICD-10-CM | POA: Diagnosis not present

## 2017-11-02 DIAGNOSIS — M9906 Segmental and somatic dysfunction of lower extremity: Secondary | ICD-10-CM | POA: Diagnosis not present

## 2017-11-03 DIAGNOSIS — M545 Low back pain: Secondary | ICD-10-CM | POA: Diagnosis not present

## 2017-11-03 DIAGNOSIS — M9906 Segmental and somatic dysfunction of lower extremity: Secondary | ICD-10-CM | POA: Diagnosis not present

## 2017-11-03 DIAGNOSIS — M9904 Segmental and somatic dysfunction of sacral region: Secondary | ICD-10-CM | POA: Diagnosis not present

## 2017-11-03 DIAGNOSIS — M9903 Segmental and somatic dysfunction of lumbar region: Secondary | ICD-10-CM | POA: Diagnosis not present

## 2017-11-08 DIAGNOSIS — M9903 Segmental and somatic dysfunction of lumbar region: Secondary | ICD-10-CM | POA: Diagnosis not present

## 2017-11-08 DIAGNOSIS — M9906 Segmental and somatic dysfunction of lower extremity: Secondary | ICD-10-CM | POA: Diagnosis not present

## 2017-11-08 DIAGNOSIS — M545 Low back pain: Secondary | ICD-10-CM | POA: Diagnosis not present

## 2017-11-08 DIAGNOSIS — M9904 Segmental and somatic dysfunction of sacral region: Secondary | ICD-10-CM | POA: Diagnosis not present

## 2017-11-09 DIAGNOSIS — M9903 Segmental and somatic dysfunction of lumbar region: Secondary | ICD-10-CM | POA: Diagnosis not present

## 2017-11-09 DIAGNOSIS — M9906 Segmental and somatic dysfunction of lower extremity: Secondary | ICD-10-CM | POA: Diagnosis not present

## 2017-11-09 DIAGNOSIS — M545 Low back pain: Secondary | ICD-10-CM | POA: Diagnosis not present

## 2017-11-09 DIAGNOSIS — M9904 Segmental and somatic dysfunction of sacral region: Secondary | ICD-10-CM | POA: Diagnosis not present

## 2017-11-10 DIAGNOSIS — M9903 Segmental and somatic dysfunction of lumbar region: Secondary | ICD-10-CM | POA: Diagnosis not present

## 2017-11-10 DIAGNOSIS — M9904 Segmental and somatic dysfunction of sacral region: Secondary | ICD-10-CM | POA: Diagnosis not present

## 2017-11-10 DIAGNOSIS — M545 Low back pain: Secondary | ICD-10-CM | POA: Diagnosis not present

## 2017-11-10 DIAGNOSIS — M9906 Segmental and somatic dysfunction of lower extremity: Secondary | ICD-10-CM | POA: Diagnosis not present

## 2017-11-14 DIAGNOSIS — M9906 Segmental and somatic dysfunction of lower extremity: Secondary | ICD-10-CM | POA: Diagnosis not present

## 2017-11-14 DIAGNOSIS — M545 Low back pain: Secondary | ICD-10-CM | POA: Diagnosis not present

## 2017-11-14 DIAGNOSIS — M9904 Segmental and somatic dysfunction of sacral region: Secondary | ICD-10-CM | POA: Diagnosis not present

## 2017-11-14 DIAGNOSIS — M9903 Segmental and somatic dysfunction of lumbar region: Secondary | ICD-10-CM | POA: Diagnosis not present

## 2017-11-16 DIAGNOSIS — M9906 Segmental and somatic dysfunction of lower extremity: Secondary | ICD-10-CM | POA: Diagnosis not present

## 2017-11-16 DIAGNOSIS — M9904 Segmental and somatic dysfunction of sacral region: Secondary | ICD-10-CM | POA: Diagnosis not present

## 2017-11-16 DIAGNOSIS — M545 Low back pain: Secondary | ICD-10-CM | POA: Diagnosis not present

## 2017-11-16 DIAGNOSIS — M9903 Segmental and somatic dysfunction of lumbar region: Secondary | ICD-10-CM | POA: Diagnosis not present

## 2017-11-17 DIAGNOSIS — M9903 Segmental and somatic dysfunction of lumbar region: Secondary | ICD-10-CM | POA: Diagnosis not present

## 2017-11-17 DIAGNOSIS — M9904 Segmental and somatic dysfunction of sacral region: Secondary | ICD-10-CM | POA: Diagnosis not present

## 2017-11-17 DIAGNOSIS — M545 Low back pain: Secondary | ICD-10-CM | POA: Diagnosis not present

## 2017-11-17 DIAGNOSIS — Z3A30 30 weeks gestation of pregnancy: Secondary | ICD-10-CM | POA: Diagnosis not present

## 2017-11-17 DIAGNOSIS — O09293 Supervision of pregnancy with other poor reproductive or obstetric history, third trimester: Secondary | ICD-10-CM | POA: Diagnosis not present

## 2017-11-17 DIAGNOSIS — M9906 Segmental and somatic dysfunction of lower extremity: Secondary | ICD-10-CM | POA: Diagnosis not present

## 2017-12-02 DIAGNOSIS — O09293 Supervision of pregnancy with other poor reproductive or obstetric history, third trimester: Secondary | ICD-10-CM | POA: Diagnosis not present

## 2017-12-02 DIAGNOSIS — Z3A32 32 weeks gestation of pregnancy: Secondary | ICD-10-CM | POA: Diagnosis not present

## 2017-12-15 DIAGNOSIS — O09293 Supervision of pregnancy with other poor reproductive or obstetric history, third trimester: Secondary | ICD-10-CM | POA: Diagnosis not present

## 2017-12-15 DIAGNOSIS — Z3A34 34 weeks gestation of pregnancy: Secondary | ICD-10-CM | POA: Diagnosis not present

## 2017-12-23 DIAGNOSIS — Z3685 Encounter for antenatal screening for Streptococcus B: Secondary | ICD-10-CM | POA: Diagnosis not present

## 2017-12-23 DIAGNOSIS — O09293 Supervision of pregnancy with other poor reproductive or obstetric history, third trimester: Secondary | ICD-10-CM | POA: Diagnosis not present

## 2017-12-23 DIAGNOSIS — Z3A35 35 weeks gestation of pregnancy: Secondary | ICD-10-CM | POA: Diagnosis not present

## 2017-12-23 LAB — OB RESULTS CONSOLE GBS: GBS: POSITIVE

## 2017-12-30 DIAGNOSIS — Z3A36 36 weeks gestation of pregnancy: Secondary | ICD-10-CM | POA: Diagnosis not present

## 2017-12-30 DIAGNOSIS — O09293 Supervision of pregnancy with other poor reproductive or obstetric history, third trimester: Secondary | ICD-10-CM | POA: Diagnosis not present

## 2018-01-03 DIAGNOSIS — O09293 Supervision of pregnancy with other poor reproductive or obstetric history, third trimester: Secondary | ICD-10-CM | POA: Diagnosis not present

## 2018-01-03 DIAGNOSIS — Z3A37 37 weeks gestation of pregnancy: Secondary | ICD-10-CM | POA: Diagnosis not present

## 2018-01-10 DIAGNOSIS — Z3A38 38 weeks gestation of pregnancy: Secondary | ICD-10-CM | POA: Diagnosis not present

## 2018-01-10 DIAGNOSIS — O09293 Supervision of pregnancy with other poor reproductive or obstetric history, third trimester: Secondary | ICD-10-CM | POA: Diagnosis not present

## 2018-01-11 ENCOUNTER — Encounter (HOSPITAL_COMMUNITY): Payer: Self-pay | Admitting: *Deleted

## 2018-01-11 ENCOUNTER — Other Ambulatory Visit: Payer: Self-pay

## 2018-01-11 ENCOUNTER — Telehealth (HOSPITAL_COMMUNITY): Payer: Self-pay | Admitting: *Deleted

## 2018-01-11 NOTE — Telephone Encounter (Signed)
Preadmission screen  

## 2018-01-13 ENCOUNTER — Encounter (HOSPITAL_COMMUNITY): Payer: Self-pay | Admitting: *Deleted

## 2018-01-13 ENCOUNTER — Inpatient Hospital Stay (HOSPITAL_COMMUNITY)
Admission: AD | Admit: 2018-01-13 | Discharge: 2018-01-15 | DRG: 806 | Disposition: A | Discharge status: Home / Self Care | Payer: BLUE CROSS/BLUE SHIELD | Source: Ambulatory Visit

## 2018-01-13 DIAGNOSIS — O9902 Anemia complicating childbirth: Secondary | ICD-10-CM | POA: Diagnosis not present

## 2018-01-13 DIAGNOSIS — T454X5A Adverse effect of iron and its compounds, initial encounter: Secondary | ICD-10-CM | POA: Diagnosis not present

## 2018-01-13 DIAGNOSIS — K521 Toxic gastroenteritis and colitis: Secondary | ICD-10-CM | POA: Diagnosis not present

## 2018-01-13 DIAGNOSIS — Y92239 Unspecified place in hospital as the place of occurrence of the external cause: Secondary | ICD-10-CM | POA: Diagnosis not present

## 2018-01-13 DIAGNOSIS — J029 Acute pharyngitis, unspecified: Secondary | ICD-10-CM | POA: Diagnosis not present

## 2018-01-13 DIAGNOSIS — Z3A38 38 weeks gestation of pregnancy: Secondary | ICD-10-CM | POA: Diagnosis not present

## 2018-01-13 DIAGNOSIS — O99824 Streptococcus B carrier state complicating childbirth: Secondary | ICD-10-CM | POA: Diagnosis not present

## 2018-01-13 DIAGNOSIS — D649 Anemia, unspecified: Secondary | ICD-10-CM | POA: Diagnosis present

## 2018-01-13 LAB — CBC
HEMATOCRIT: 28.9 % — AB (ref 36.0–46.0)
HEMOGLOBIN: 9.5 g/dL — AB (ref 12.0–15.0)
MCH: 30.4 pg (ref 26.0–34.0)
MCHC: 32.9 g/dL (ref 30.0–36.0)
MCV: 92.3 fL (ref 78.0–100.0)
Platelets: 141 10*3/uL — ABNORMAL LOW (ref 150–400)
RBC: 3.13 MIL/uL — AB (ref 3.87–5.11)
RDW: 13.9 % (ref 11.5–15.5)
WBC: 8.2 10*3/uL (ref 4.0–10.5)

## 2018-01-13 LAB — TYPE AND SCREEN
ABO/RH(D): A POS
ANTIBODY SCREEN: NEGATIVE

## 2018-01-13 LAB — RPR: RPR: NONREACTIVE

## 2018-01-13 LAB — POCT FERN TEST: POCT FERN TEST: POSITIVE

## 2018-01-13 MED ORDER — FLEET ENEMA 7-19 GM/118ML RE ENEM: 1.0000 | ENEMA | Freq: Every day | RECTAL | Status: DC | PRN

## 2018-01-13 MED ORDER — DIBUCAINE 1 % RE OINT: 1.0000 "application " | TOPICAL_OINTMENT | RECTAL | Status: DC | PRN

## 2018-01-13 MED ORDER — ONDANSETRON HCL 4 MG PO TABS: 4.0000 mg | ORAL_TABLET | ORAL | Status: DC | PRN

## 2018-01-13 MED ORDER — BENZOCAINE-MENTHOL 20-0.5 % EX AERO
1.0000 "application " | INHALATION_SPRAY | CUTANEOUS | Status: DC | PRN
  Administered 2018-01-14: 1 via TOPICAL
  Filled 2018-01-13 (×2): qty 56

## 2018-01-13 MED ORDER — SIMETHICONE 80 MG PO CHEW: 80.0000 mg | CHEWABLE_TABLET | ORAL | Status: DC | PRN

## 2018-01-13 MED ORDER — DOCUSATE SODIUM 100 MG PO CAPS: 100.0000 mg | ORAL_CAPSULE | Freq: Two times a day (BID) | ORAL | Status: DC

## 2018-01-13 MED ORDER — ONDANSETRON HCL 4 MG/2ML IJ SOLN
4.0000 mg | Freq: Four times a day (QID) | INTRAMUSCULAR | Status: DC | PRN
  Administered 2018-01-13: 4 mg via INTRAVENOUS
  Filled 2018-01-13: qty 2

## 2018-01-13 MED ORDER — COCONUT OIL OIL: 1.0000 "application " | TOPICAL_OIL | Status: DC | PRN

## 2018-01-13 MED ORDER — OXYCODONE HCL 5 MG PO TABS: 5.0000 mg | ORAL_TABLET | ORAL | Status: DC | PRN

## 2018-01-13 MED ORDER — SODIUM CHLORIDE 0.9 % IV SOLN
500.0000 mg | INTRAVENOUS | Status: DC
  Administered 2018-01-13: 500 mg via INTRAVENOUS
  Filled 2018-01-13: qty 25

## 2018-01-13 MED ORDER — DIPHENHYDRAMINE HCL 25 MG PO CAPS: 25.0000 mg | ORAL_CAPSULE | Freq: Four times a day (QID) | ORAL | Status: DC | PRN

## 2018-01-13 MED ORDER — IBUPROFEN 800 MG PO TABS
800.0000 mg | ORAL_TABLET | Freq: Once | ORAL | Status: AC
  Administered 2018-01-13: 800 mg via ORAL
  Filled 2018-01-13: qty 1

## 2018-01-13 MED ORDER — TETANUS-DIPHTH-ACELL PERTUSSIS 5-2.5-18.5 LF-MCG/0.5 IM SUSP: 0.5000 mL | Freq: Once | INTRAMUSCULAR | Status: DC

## 2018-01-13 MED ORDER — LACTATED RINGERS IV SOLN: 500.0000 mL | INTRAVENOUS | Status: DC | PRN

## 2018-01-13 MED ORDER — ONDANSETRON HCL 4 MG/2ML IJ SOLN
4.0000 mg | INTRAMUSCULAR | Status: DC | PRN
  Administered 2018-01-13: 4 mg via INTRAVENOUS
  Filled 2018-01-13: qty 2

## 2018-01-13 MED ORDER — BISACODYL 10 MG RE SUPP: 10.0000 mg | Freq: Every day | RECTAL | Status: DC | PRN

## 2018-01-13 MED ORDER — OXYCODONE-ACETAMINOPHEN 5-325 MG PO TABS: 2.0000 | ORAL_TABLET | ORAL | Status: DC | PRN

## 2018-01-13 MED ORDER — METHYLERGONOVINE MALEATE 0.2 MG/ML IJ SOLN
0.2000 mg | INTRAMUSCULAR | Status: DC | PRN
  Administered 2018-01-13: 0.2 mg via INTRAMUSCULAR

## 2018-01-13 MED ORDER — WITCH HAZEL-GLYCERIN EX PADS: 1.0000 "application " | MEDICATED_PAD | CUTANEOUS | Status: DC | PRN

## 2018-01-13 MED ORDER — LACTATED RINGERS IV BOLUS
300.0000 mL | Freq: Once | INTRAVENOUS | Status: AC
  Administered 2018-01-13: 300 mL via INTRAVENOUS

## 2018-01-13 MED ORDER — IBUPROFEN 600 MG PO TABS
600.0000 mg | ORAL_TABLET | Freq: Four times a day (QID) | ORAL | Status: DC
  Administered 2018-01-13 – 2018-01-15 (×7): 600 mg via ORAL
  Filled 2018-01-13 (×7): qty 1

## 2018-01-13 MED ORDER — OXYCODONE-ACETAMINOPHEN 5-325 MG PO TABS: 1.0000 | ORAL_TABLET | ORAL | Status: DC | PRN

## 2018-01-13 MED ORDER — FLEET ENEMA 7-19 GM/118ML RE ENEM: 1.0000 | ENEMA | RECTAL | Status: DC | PRN

## 2018-01-13 MED ORDER — ACETAMINOPHEN 325 MG PO TABS: 650.0000 mg | ORAL_TABLET | ORAL | Status: DC | PRN

## 2018-01-13 MED ORDER — OXYTOCIN 40 UNITS IN LACTATED RINGERS INFUSION - SIMPLE MED
2.5000 [IU]/h | INTRAVENOUS | Status: DC
  Filled 2018-01-13: qty 1000

## 2018-01-13 MED ORDER — LIDOCAINE HCL (PF) 1 % IJ SOLN
30.0000 mL | INTRAMUSCULAR | Status: AC | PRN
  Administered 2018-01-13: 30 mL via SUBCUTANEOUS
  Filled 2018-01-13: qty 30

## 2018-01-13 MED ORDER — PRENATAL MULTIVITAMIN CH
1.0000 | ORAL_TABLET | Freq: Every day | ORAL | Status: DC
  Administered 2018-01-14 – 2018-01-15 (×2): 1 via ORAL
  Filled 2018-01-13 (×2): qty 1

## 2018-01-13 MED ORDER — SOD CITRATE-CITRIC ACID 500-334 MG/5ML PO SOLN: 30.0000 mL | ORAL | Status: DC | PRN

## 2018-01-13 MED ORDER — ACETAMINOPHEN 325 MG PO TABS
650.0000 mg | ORAL_TABLET | ORAL | Status: DC | PRN
  Administered 2018-01-13 (×2): 650 mg via ORAL
  Filled 2018-01-13 (×2): qty 2

## 2018-01-13 MED ORDER — POLYSACCHARIDE IRON COMPLEX 150 MG PO CAPS
150.0000 mg | ORAL_CAPSULE | Freq: Every day | ORAL | Status: DC
  Administered 2018-01-14 – 2018-01-15 (×2): 150 mg via ORAL
  Filled 2018-01-13 (×3): qty 1

## 2018-01-13 MED ORDER — METHYLERGONOVINE MALEATE 0.2 MG PO TABS: 0.2000 mg | ORAL_TABLET | ORAL | Status: DC | PRN

## 2018-01-13 MED ORDER — LACTATED RINGERS IV SOLN: INTRAVENOUS | Status: DC

## 2018-01-13 MED ORDER — SODIUM CHLORIDE 0.9 % IV SOLN
2.0000 g | Freq: Once | INTRAVENOUS | Status: AC
  Administered 2018-01-13: 2 g via INTRAVENOUS
  Filled 2018-01-13: qty 2

## 2018-01-13 MED ORDER — OXYTOCIN BOLUS FROM INFUSION
500.0000 mL | Freq: Once | INTRAVENOUS | Status: DC
  Administered 2018-01-13: 500 mL via INTRAVENOUS

## 2018-01-13 MED ORDER — ONDANSETRON HCL 4 MG/2ML IJ SOLN: 4.0000 mg | INTRAMUSCULAR | Status: DC | PRN

## 2018-01-13 MED ORDER — MAGNESIUM OXIDE 400 (241.3 MG) MG PO TABS
400.0000 mg | ORAL_TABLET | Freq: Every day | ORAL | Status: DC
  Administered 2018-01-14 – 2018-01-15 (×2): 400 mg via ORAL
  Filled 2018-01-13 (×4): qty 1

## 2018-01-13 NOTE — Progress Notes (Signed)
Patient is able to ambulate in room independently, not feeling lightheaded or dizzy.  She stated she was steady on her feed and feeling much better than earlier.  No stool since 1230 today and states she is not feeling sick anymore.  Patient is ordering dinner to see how that settles.   Infection control called RN back and stated that as long and patient wore a clean gown and did good hand hygiene she could go to NICU.  They also stated she still did not need to get in infants face and NICU needed to bleach wipe where she sat and what she touched in NICU.  RN will call NICU RN to let her know.  Patient was also informed that if she was more comfortable she could wear a mask.  Nasier Thumm, [place] N

## 2018-01-13 NOTE — H&P (Signed)
OB ADMISSION/ HISTORY & PHYSICAL:  Admission Date: 01/13/2018  1:08 AM  Admit Diagnosis: Term pregnancy, active labor   Alison Turner is a 27 y.o. female presenting for contractions and LOF, SROM at 0100. Hx rapid labor and advanced dilation. Notes fluid pink.   Prenatal History: [id number]   EDC : 01/22/2018, by Other Basis  Prenatal care at [facility] since [redacted] weeks gestation  Prenatal course complicated by hx PTB 36.6 wks, declined Makena, [facility] 1st trim, marginal CI, advanced dilation, GBS positive  Prenatal Labs: ABO, Rh: A (12/26 0000)  Antibody: Negative (12/26 0000) Rubella: Immune (12/26 0000)  RPR: Nonreactive (12/26 0000)  HBsAg: Negative (12/26 0000)  HIV: Non-reactive (12/26 0000)  GBS: Positive (06/21 0000)  1 hr Glucola : 106 Genetic screen: Quad screen normal  Anatomy scan normal   Medical / Surgical History :  Past medical history:  Past Medical History:  Diagnosis Date  . Headache   . Kidney calculi   . Traumatic injury during pregnancy in third trimester 06/19/2015     Past surgical history:  Past Surgical History:  Procedure Laterality Date  . MOUTH SURGERY       Family History:  Family History  Problem Relation Age of Onset  . Hypertension Mother   . Kidney disease Father   . Cancer Maternal Grandmother   . Diabetes Maternal Grandmother   . Rheum arthritis Maternal Grandmother   . Kidney disease Maternal Grandfather   . Heart disease Maternal Grandfather   . Stroke Maternal Grandfather   . Rheum arthritis Maternal Grandfather   . Hypertension Paternal Grandmother   . Heart disease Paternal Grandmother   . Stroke Paternal Grandmother   . Rheum arthritis Paternal Grandmother   . Kidney disease Paternal Grandfather   . Heart disease Paternal Grandfather   . Hypertension Paternal Grandfather   . Rheum arthritis Paternal Grandfather   . Healthy Sister   . Healthy Brother   . Healthy Sister   . Healthy Sister   .  Healthy Sister   . Healthy Brother   . Healthy Brother      Social History:  reports that she has never smoked. She has never used smokeless tobacco. She reports that she does not drink alcohol or use drugs.   Allergies: Patient has no known allergies.    Current Medications at time of admission:  Medications Prior to Admission  Medication Sig Dispense Refill Last Dose  . fluticasone (FLONASE) 50 MCG/ACT nasal spray    Taking  . progesterone 200 MG SUPP Place 200 mg vaginally at bedtime.   03/17/2017 at Unknown time      Review of Systems: ROS  As noted above  Physical Exam:  Dilation: 8 Effacement (%): 100 Station: -1 Exam by:: Caprice Renshaw, RN  VSSAF General: AAO x 3, breathing w/ ctx, feels pressure Heart: RRR Lungs:CTAB Abdomen:gravid, NT Extremities:no edema Genitalia / VE: c/c/+2 FHR:130, mod var, + accels, no decels TOCO: q 2-3 min, strong  Labs:    Recent Labs    01/13/18 0147  WBC 8.2  HGB 9.5*  HCT 28.9*  PLT 141*       Assessment:  27 y.o. [id number] at [redacted]w[redacted]d  1. Second stage of labor 2. FHR category 1 3. GBS positive 4. Desires unmedicated birth 5. Breastfeeding 6. Requests placenta  Plan:  1. Admit to BS 2. Routine L&D orders 3. GBS prophylaxis - Ampicillin per protocol 4. Prepare room for delivery 5. Anticipate NSVB  Dr Billy Coastaavon notified of admission / plan of care   Alison Turner CNM, MSN 01/13/2018, 2:36 AM

## 2018-01-13 NOTE — MAU Note (Signed)
Pt came to MAU in active labor. Cervical exam 8/100/-1  Pt reports onset of heavy bleeding at home.   Expedited admission to labor and delivery.

## 2018-01-13 NOTE — Lactation Note (Signed)
This note was copied from a baby's chart. Lactation Consultation Note  Patient Name: Alison Otilio CarpenSarah Straw WUJWJ'XToday's Date: 01/13/2018 Reason for consult: Initial assessment;Early term 37-38.6wks;NICU baby, bruising at risk for jaundice 19 hour old infant in NICU LC put gown on before entering room due warning sign outside door (infection control). Mom sitting  in bed , per mom she has concerns about milk supply. Mom stated she had more colostrum with first child, whom she breastfeed for two years.  Mom had expressed 0.5 ml of colostrum using DEBP and nurse will take to NICU. Mom has EBM labels to take to NICU. Mom will hand express before pumping and afterwards; and will massage breast while pumping. Explained to Mom each baby and breastfeeding experience is different, discussed tummy size of newborn and colostrum first few days. Mom was receptive of LC Mom will use DEBP every 3 hours within 24 hours, take EBM to NICU. LC discussed NICU infant behaviors. Mom made aware of O/P services, breastfeeding support groups, community resources, and our phone # for post-discharge questions.  Maternal Data Formula Feeding for Exclusion: Yes Reason for exclusion: Admission to Intensive Care Unit (ICU) post-partum Has patient been taught Hand Expression?: Yes Does the patient have breastfeeding experience prior to this delivery?: Yes  Feeding Feeding Type: Breast Fed  LATCH Score                   Interventions Interventions: Breast compression;DEBP;Hand pump;Breast massage;Expressed milk;Breast feeding basics reviewed  Lactation Tools Discussed/Used Tools: Pump Breast pump type: Double-Electric Breast Pump;Manual WIC Program: No   Consult Status Consult Status: Follow-up Date: 01/14/18 Follow-up type: In-patient    Danelle EarthlyRobin Arnulfo Batson 01/13/2018, 10:54 PM

## 2018-01-13 NOTE — Progress Notes (Signed)
PROBLEM VISIT  S:  Reports feeling poorly - onset of diarrhea shortly before labor - mother at bedside reports similar symptoms (3-4 episodes of diarrhea since admit)             Tolerating po/ No nausea or vomiting             Bleeding is light             Pain controlled with Motrin             Up ad lib / ambulatory / voiding QS  Newborn in NICU  O:   VS: BP 97/70   Pulse 90   Temp 98.1 F (36.7 C) (Oral)   Resp 18   Ht 5\' 3"  (1.6 m)   Wt 68.6 kg (151 lb 4 oz)   LMP 02/05/2017   SpO2 97%   BMI 26.79 kg/m   LABS:             Recent Labs    01/13/18 0147  WBC 8.2  HGB 9.5*  PLT 141*               Blood type: --/--/A POS (07/12 0147)  Rubella: Immune (12/26 0000)                     I&O: Intake/Output      07/11 0701 - 07/12 0700 07/12 0701 - 07/13 0700   Blood 1150    Total Output 1150    Net -1150           A: PPD # 0             Diarrhea from labor & meds versus infection etiology     P: Enteric / contact precautions             Stool culture sent  Avoid NICU visits - Mom and Grandmother until etiology can be identified  Marlinda Mikeanya Moana Munford CNM, MSN, [facility]NM 01/13/2018, 1:57 PM

## 2018-01-13 NOTE — Progress Notes (Signed)
At 1120 this morning patient came back down from NICU stating she felt very sick.  When RN got in room she was very nauseous and vomiting.  She stated her hands and feet were swollen and burning feeling.  She was also feeling lots of tightness in her chest.  All of her vital signs were WNL.  RN gave zofran and she laid down to sleep. RN concerned that it could be related to her iron infusion, infusion was complete so RN stopped IV fluids.  Alison Turner was notified and she stated it did not sound like a reaction and to continue to monitor vital signs, rest and drink fluids and try eating.   Around 12 patient called out for RN stating she had a loose stool in the bed.  RN assisted patient to the bathroom where she had more loose stool.  While in the bathroom patient was in and out of consciousness and lost consciousness briefly.  As patient was going to go back to the bed she stated she needed to go again and had another loose stool.  Patient stated they were uncontrollable and she was not even trying to go.  RN got patient back to bed and vital signs were still WNL.  Alison Turner was notified again and gave verbal orders for stool culture and LR bolus of 300ml at 150/hr and stated she was coming to see the patient.    Patient has been advised to stay out of NICU for now and RN has contacted infection control to find out how long she needed to stay out of NICU and if we could give breastmilk to the infant.    Alison Turner, [place]

## 2018-01-14 DIAGNOSIS — O9902 Anemia complicating childbirth: Secondary | ICD-10-CM | POA: Diagnosis present

## 2018-01-14 LAB — CBC
HCT: 25.9 % — ABNORMAL LOW (ref 36.0–46.0)
HEMOGLOBIN: 8.8 g/dL — AB (ref 12.0–15.0)
MCH: 31.1 pg (ref 26.0–34.0)
MCHC: 34 g/dL (ref 30.0–36.0)
MCV: 91.5 fL (ref 78.0–100.0)
Platelets: 147 10*3/uL — ABNORMAL LOW (ref 150–400)
RBC: 2.83 MIL/uL — ABNORMAL LOW (ref 3.87–5.11)
RDW: 14 % (ref 11.5–15.5)
WBC: 10.2 10*3/uL (ref 4.0–10.5)

## 2018-01-14 LAB — GROUP A STREP BY PCR: Group A Strep by PCR: NOT DETECTED

## 2018-01-14 NOTE — Progress Notes (Signed)
PPD # 1 SVD Information for the patient's newborn:  Birdie HopesWheeler, Boy Izela [[id number]][[id number]]  female    Breast feeding/pumping colostrum  / Circumcision planned outpatient Baby name: Renata CapriceConrad In NICU - respiratory/?sepsis  S:  Reports feeling much better today, no further episodes of diarrhea, no BM's since yesterday. Notes mild sore throat this am. Spouse also with sore throat and went to Urgent Care for Strep testing. No fever or malaise.              Tolerating po/ No nausea or vomiting             Bleeding is light             Pain controlled with ibuprofen (OTC)             Up ad lib / ambulatory / voiding without difficulties        O:  A & O x 3, in no apparent distress              VS:  Vitals:   01/13/18 1230 01/13/18 1527 01/13/18 2100 01/14/18 0558  BP: 97/70 108/66 115/76 125/90  Pulse:  74 81 66  Resp:  16 16 16   Temp:  98.3 F (36.8 C) 98.7 F (37.1 C) 98.3 F (36.8 C)  TempSrc:  Oral Oral Oral  SpO2: 97% 97% 99% 99%  Weight:      Height:        LABS:  Recent Labs    01/13/18 0147 01/14/18 0532  WBC 8.2 10.2  HGB 9.5* 8.8*  HCT 28.9* 25.9*  PLT 141* 147*    Blood type: --/--/A POS (07/12 0147)  Rubella: Immune (12/26 0000)   I&O: I/O last 3 completed shifts: In: 375 [IV Piggyback:375] Out: 1150 [Blood:1150]    Gen: AAO x 3, NAD     Lungs: Clear and unlabored  Heart: regular rate and rhythm / no murmurs  Abdomen: soft, non-tender, non-distended             Fundus: firm, non-tender, U-2  Perineum: repair intact  Lochia: scant  Extremities: no edema, no calf pain or tenderness    A/P: PPD # 1 27 y.o., [id number]   Active Problems:   SVD 7/12   Postpartum care following vaginal delivery   Maternal anemia, with delivery  - s/p Venofer x 1 dose 7/12  - started oral Fe and Mag Ox   GI upset - uncontrolled diarrhea yesterday x several episodes over the course of couple hours, none since  - stool cx not done, no further loose stool to sample since initial  episodes  - no other family members with s/sx  - suspect side effect of parenteral iron  - DC contact precautions now Sore throat  - throat cx per patient request to r/o strep A, she is concerned for any infectious process may compromise infant in NICU    Doing well - stable status  Routine post partum orders  Anticipate discharge tomorrow    Neta Mendsaniela C Paul, MSN, CNM 01/14/2018, 8:39 AM

## 2018-01-15 ENCOUNTER — Inpatient Hospital Stay (HOSPITAL_COMMUNITY): Admission: RE | Admit: 2018-01-15 | Payer: BLUE CROSS/BLUE SHIELD | Source: Ambulatory Visit

## 2018-01-15 ENCOUNTER — Other Ambulatory Visit: Payer: Self-pay

## 2018-01-15 ENCOUNTER — Encounter (HOSPITAL_COMMUNITY): Payer: Self-pay | Admitting: Student

## 2018-01-15 MED ORDER — BENZOCAINE-MENTHOL 20-0.5 % EX AERO: 1.0000 "application " | INHALATION_SPRAY | CUTANEOUS | Status: DC | PRN

## 2018-01-15 MED ORDER — MAGNESIUM OXIDE 400 (241.3 MG) MG PO TABS: 400.0000 mg | ORAL_TABLET | Freq: Every day | ORAL | Status: DC

## 2018-01-15 MED ORDER — ACETAMINOPHEN 325 MG PO TABS: 650.0000 mg | ORAL_TABLET | ORAL | Status: DC | PRN

## 2018-01-15 MED ORDER — COCONUT OIL OIL: 1.0000 "application " | TOPICAL_OIL | 0 refills | Status: DC | PRN

## 2018-01-15 MED ORDER — POLYSACCHARIDE IRON COMPLEX 150 MG PO CAPS: 150.0000 mg | ORAL_CAPSULE | Freq: Every day | ORAL | Status: DC

## 2018-01-15 MED ORDER — IBUPROFEN 600 MG PO TABS: 600.0000 mg | ORAL_TABLET | Freq: Four times a day (QID) | ORAL | 0 refills | Status: DC

## 2018-01-15 NOTE — Discharge Summary (Addendum)
Obstetric Discharge Summary Reason for Admission: onset of labor and rupture of membranes Prenatal Procedures: ultrasound Intrapartum Procedures: spontaneous vaginal delivery Postpartum Procedures: IV iron Complications-Operative and Postpartum: R periclitoral laceration repaired Hemoglobin  Date Value Ref Range Status  01/14/2018 8.8 (L) 12.0 - 15.0 g/dL Final   HCT  Date Value Ref Range Status  01/14/2018 25.9 (L) 36.0 - 46.0 % Final    Physical Exam:  General: alert, cooperative and no distress Lochia: appropriate Uterine Fundus: firm Incision: healing well DVT Evaluation: No cords or calf tenderness. No significant calf/ankle edema.  Discharge Diagnoses:  Patient Active Problem List   Diagnosis Date Noted  . SVD 7/12 01/13/2018    Priority: Medium  . Maternal anemia, with delivery 01/14/2018  . Postpartum care following vaginal delivery (7/12) 07/17/2015   Discharge Information: Date: 01/15/2018 Activity: pelvic rest Diet: routine Medications:  Allergies as of 01/15/2018   No Known Allergies     Medication List    TAKE these medications   acetaminophen 325 MG tablet Commonly known as:  TYLENOL Take 2 tablets (650 mg total) by mouth every 4 (four) hours as needed (for pain scale < 4).   benzocaine-Menthol 20-0.5 % Aero Commonly known as:  DERMOPLAST Apply 1 application topically as needed for irritation (perineal discomfort).   coconut oil Oil Apply 1 application topically as needed.   ibuprofen 600 MG tablet Commonly known as:  ADVIL,MOTRIN Take 1 tablet (600 mg total) by mouth every 6 (six) hours.   iron polysaccharides 150 MG capsule Commonly known as:  NIFEREX Take 1 capsule (150 mg total) by mouth daily.   magnesium oxide 400 (241.3 Mg) MG tablet Commonly known as:  MAG-OX Take 1 tablet (400 mg total) by mouth daily.   prenatal multivitamin Tabs tablet Take 1 tablet by mouth daily at 12 noon.      Condition: stable Instructions: refer to  practice specific booklet Discharge to: home Follow-up Information    Neta Mendsaul, Cherita Hebel C, CNM. Schedule an appointment as soon as possible for a visit in 6 week(s).   Specialty:  Obstetrics and Gynecology Contact information: [place] [place] [id number] [phone]           Newborn Data: Live born female Conor Birth Weight: 6 lb 10.4 oz (3016 g) APGAR: 8, 9  Newborn Delivery   Birth date/time:  01/13/2018 01:59:00 Delivery type:  Vaginal, Spontaneous     In NICU, mey DC home with mother 01/16/18, mother boarding status in interim.  Neta MendsDaniela C Meily Glowacki, CNM 01/15/2018, 9:10 AM

## 2018-01-15 NOTE — Progress Notes (Signed)
Post Partum Day #2  S/P NSVB         Information for the patient's newborn:  Birdie HopesWheeler, Boy Berania [[id number]][[id number]]  female    circumcision planned outpatient Baby name: Conor Feeding: breast NICU for resp distress/?sepsis - plan to wean ioff IV and may be DCed home with mother tomorrow  Subjective: No HA, SOB, CP, breast symptoms.  Pain minimal, some abdominal tenderness since diarrhea episodes.  Normal vaginal bleeding, no clots.   Voiding freely.    Objective:  VS:  Vitals:   01/14/18 0558 01/14/18 1420 01/14/18 2328 01/15/18 0613  BP: 125/90 128/75 120/76 119/79  Pulse: 66 60 91 78  Resp: 16 16  18   Temp: 98.3 F (36.8 C) 98.6 F (37 C) 99.1 F (37.3 C) 98.3 F (36.8 C)  TempSrc: Oral Oral Oral Oral  SpO2: 99%   99%  Weight:      Height:        No intake or output data in the 24 hours ending 01/15/18 0902    Recent Labs    01/13/18 0147 01/14/18 0532  WBC 8.2 10.2  HGB 9.5* 8.8*  HCT 28.9* 25.9*  PLT 141* 147*    Blood type: --/--/A POS (07/12 0147) Rubella: Immune (12/26 0000)    Physical Exam:  General: alert, cooperative and no distress  Abd: soft, NT to palp Uterine Fundus: firm Lochia: appropriate Perineum: repair intact, no edema DVT Evaluation: No cords or calf tenderness. No significant calf/ankle edema.    Assessment/Plan: PPD # 2 / 27 y.o., [id number]    Active Problems:   SVD 7/12   Postpartum care following vaginal delivery (7/12)   Maternal anemia, with delivery  - s/p IV iron (venofer), severe GI upse, uncontrolled diarrhea after administration  - rec taking only oral Fe as that has been tolerable,   - if desires to repeat IV iron in 7 days, consider premedication with loperamide    normal postpartum exam  Continue current postpartum care             DC to boarding today w/ instructions, may room in with newborn if NICU discharge pending  [facility] and PRN   LOS: 2 days   Neta Mendsaniela C Amiracle Neises, CNM, MSN 01/15/2018,  9:02 AM

## 2018-01-15 NOTE — Lactation Note (Signed)
This note was copied from a baby's chart. Lactation Consultation Note  Patient Name: Alison Otilio CarpenSarah Udovich NWGNF'AToday's Date: 01/15/2018 Reason for consult: Follow-up assessment;NICU baby Mom just finished a 10 minute feeding in NICU.  Baby content and relaxed.  Milk is in and mom post pumping.  Mom and baby will hopefully room in tonight.  Answered questions.  Instructed to call for assist prn.  Maternal Data    Feeding Feeding Type: Breast Fed Length of feed: 25 min  LATCH Score Latch: Grasps breast easily, tongue down, lips flanged, rhythmical sucking.  Audible Swallowing: Spontaneous and intermittent  Type of Nipple: Everted at rest and after stimulation  Comfort (Breast/Nipple): Soft / non-tender  Hold (Positioning): No assistance needed to correctly position infant at breast.  LATCH Score: 10  Interventions    Lactation Tools Discussed/Used     Consult Status Consult Status: PRN Follow-up type: In-patient    Huston FoleyMOULDEN, Lael Wetherbee S 01/15/2018, 10:17 AM

## 2018-02-03 DIAGNOSIS — O9229 Other disorders of breast associated with pregnancy and the puerperium: Secondary | ICD-10-CM | POA: Diagnosis not present

## 2018-02-16 DIAGNOSIS — D225 Melanocytic nevi of trunk: Secondary | ICD-10-CM | POA: Diagnosis not present

## 2018-02-16 DIAGNOSIS — B078 Other viral warts: Secondary | ICD-10-CM | POA: Diagnosis not present

## 2018-02-16 DIAGNOSIS — L723 Sebaceous cyst: Secondary | ICD-10-CM | POA: Diagnosis not present

## 2018-02-16 DIAGNOSIS — B079 Viral wart, unspecified: Secondary | ICD-10-CM | POA: Diagnosis not present

## 2018-02-16 DIAGNOSIS — D229 Melanocytic nevi, unspecified: Secondary | ICD-10-CM | POA: Diagnosis not present

## 2018-03-03 DIAGNOSIS — N939 Abnormal uterine and vaginal bleeding, unspecified: Secondary | ICD-10-CM | POA: Diagnosis not present

## 2018-03-03 DIAGNOSIS — M545 Low back pain: Secondary | ICD-10-CM | POA: Diagnosis not present

## 2018-03-17 ENCOUNTER — Encounter (HOSPITAL_COMMUNITY): Payer: Self-pay

## 2019-01-08 DIAGNOSIS — R110 Nausea: Secondary | ICD-10-CM | POA: Diagnosis not present

## 2019-01-08 DIAGNOSIS — R5383 Other fatigue: Secondary | ICD-10-CM | POA: Diagnosis not present

## 2019-05-02 ENCOUNTER — Other Ambulatory Visit: Payer: Self-pay

## 2019-05-02 DIAGNOSIS — Z20822 Contact with and (suspected) exposure to covid-19: Secondary | ICD-10-CM

## 2019-05-03 LAB — NOVEL CORONAVIRUS, NAA: SARS-CoV-2, NAA: NOT DETECTED

## 2019-05-11 DIAGNOSIS — R0789 Other chest pain: Secondary | ICD-10-CM | POA: Diagnosis not present

## 2019-05-17 DIAGNOSIS — Z01419 Encounter for gynecological examination (general) (routine) without abnormal findings: Secondary | ICD-10-CM | POA: Diagnosis not present

## 2019-05-17 DIAGNOSIS — Z681 Body mass index (BMI) 19 or less, adult: Secondary | ICD-10-CM | POA: Diagnosis not present

## 2019-05-19 ENCOUNTER — Ambulatory Visit (HOSPITAL_COMMUNITY)
Admission: EM | Admit: 2019-05-19 | Discharge: 2019-05-19 | Disposition: A | Discharge status: Home / Self Care | Payer: BC Managed Care – PPO | Attending: Internal Medicine | Admitting: Internal Medicine

## 2019-05-19 ENCOUNTER — Telehealth (HOSPITAL_COMMUNITY): Payer: Self-pay | Admitting: *Deleted

## 2019-05-19 ENCOUNTER — Other Ambulatory Visit: Payer: Self-pay

## 2019-05-19 ENCOUNTER — Encounter (HOSPITAL_COMMUNITY): Payer: Self-pay

## 2019-05-19 DIAGNOSIS — N3001 Acute cystitis with hematuria: Secondary | ICD-10-CM | POA: Diagnosis not present

## 2019-05-19 LAB — POCT URINALYSIS DIP (DEVICE)
Glucose, UA: NEGATIVE mg/dL
Ketones, ur: NEGATIVE mg/dL
Nitrite: POSITIVE — AB
Protein, ur: >=300 mg/dL — AB
Specific Gravity, Urine: 1.025 (ref 1.005–1.030)
Urobilinogen, UA: 1 mg/dL (ref 0.0–1.0)
pH: 6.5 (ref 5.0–8.0)

## 2019-05-19 MED ORDER — TERCONAZOLE 0.8 % VA CREA: 1.0000 | TOPICAL_CREAM | Freq: Every day | VAGINAL | 0 refills | Status: DC

## 2019-05-19 MED ORDER — CEPHALEXIN 500 MG PO CAPS: 500.0000 mg | ORAL_CAPSULE | Freq: Two times a day (BID) | ORAL | 0 refills | Status: DC

## 2019-05-19 MED ORDER — CEPHALEXIN 500 MG PO CAPS: 500.0000 mg | ORAL_CAPSULE | Freq: Two times a day (BID) | ORAL | 0 refills | Status: AC

## 2019-05-19 NOTE — Discharge Instructions (Signed)
Treating you for a yeast infection and a UTI. Take the medication as prescribed.  Make sure you are drinking plenty of fluids to include water Follow up as needed for continued or worsening symptoms

## 2019-05-19 NOTE — ED Triage Notes (Signed)
Pt present UTI, with blood. The bleeding started about an hour ago. Symptoms started about two days ago with some itching and burning.

## 2019-05-21 NOTE — ED Provider Notes (Signed)
[facility]    CSN: [id number] Arrival date & time: 05/19/19  1728      History   Chief Complaint Chief Complaint  Patient presents with  . Urinary Tract Infection    HPI Alison Turner is a 28 y.o. female.   Patient is a 28 year old female who presents today with dysuria, hematuria and urinary frequency.  Symptoms been constant starting today.  She is also has some mild vaginal discharge, itching and irritation.  Reporting an increase in sexual activity recently due to trying to conceive.  Recent ovulation.Patient's last menstrual period was 04/30/2019.  No concern for STDs.  No abdominal pain, back pain, fevers.  ROS per HPI       Past Medical History:  Diagnosis Date  . Headache   . Kidney calculi   . Traumatic injury during pregnancy in third trimester 06/19/2015    Patient Active Problem List   Diagnosis Date Noted  . Maternal anemia, with delivery 01/14/2018  . SVD 7/12 01/13/2018  . Postpartum care following vaginal delivery (7/12) 07/17/2015  . Traumatic injury during pregnancy in third trimester 06/19/2015    Past Surgical History:  Procedure Laterality Date  . MOUTH SURGERY      OB History    Gravida  3   Para  1   Term      Preterm  1   AB  1   Living  1     SAB  1   TAB      Ectopic      Multiple  0   Live Births  1            Home Medications    Prior to Admission medications   Medication Sig Start Date End Date Taking? Authorizing Provider  acetaminophen (TYLENOL) 325 MG tablet Take 2 tablets (650 mg total) by mouth every 4 (four) hours as needed (for pain scale < 4). 01/15/18   Neta Mends, CNM  benzocaine-Menthol (DERMOPLAST) 20-0.5 % AERO Apply 1 application topically as needed for irritation (perineal discomfort). 01/15/18   Neta Mends, CNM  cephALEXin (KEFLEX) 500 MG capsule Take 1 capsule (500 mg total) by mouth 2 (two) times daily for 7 days. 05/19/19 05/26/19  Dahlia Byes A, NP  coconut  oil OIL Apply 1 application topically as needed. 01/15/18   Neta Mends, CNM  ibuprofen (ADVIL,MOTRIN) 600 MG tablet Take 1 tablet (600 mg total) by mouth every 6 (six) hours. 01/15/18   Neta Mends, CNM  iron polysaccharides (NIFEREX) 150 MG capsule Take 1 capsule (150 mg total) by mouth daily. 01/15/18   Neta Mends, CNM  magnesium oxide (MAG-OX) 400 (241.3 Mg) MG tablet Take 1 tablet (400 mg total) by mouth daily. 01/15/18   Neta Mends, CNM  Prenatal Vit-Fe Fumarate-FA (PRENATAL MULTIVITAMIN) TABS tablet Take 1 tablet by mouth daily at 12 noon.    [provider]  terconazole (TERAZOL 3) 0.8 % vaginal cream Place 1 applicator vaginally at bedtime. 05/19/19   Janace Aris, NP    Family History Family History  Problem Relation Age of Onset  . Hypertension Mother   . Kidney disease Father   . Cancer Maternal Grandmother   . Diabetes Maternal Grandmother   . Rheum arthritis Maternal Grandmother   . Kidney disease Maternal Grandfather   . Heart disease Maternal Grandfather   . Stroke Maternal Grandfather   . Rheum arthritis Maternal Grandfather   . Hypertension Paternal Grandmother   .  Heart disease Paternal Grandmother   . Stroke Paternal Grandmother   . Rheum arthritis Paternal Grandmother   . Kidney disease Paternal Grandfather   . Heart disease Paternal Grandfather   . Hypertension Paternal Grandfather   . Rheum arthritis Paternal Grandfather   . Healthy Sister   . Healthy Brother   . Healthy Sister   . Healthy Sister   . Healthy Sister   . Healthy Brother   . Healthy Brother     Social History Social History   Tobacco Use  . Smoking status: Never Smoker  . Smokeless tobacco: Never Used  Substance Use Topics  . Alcohol use: No    Comment: Occasionally  . Drug use: No     Allergies   Patient has no known allergies.   Review of Systems Review of Systems   Physical Exam Triage Vital Signs ED Triage Vitals [05/19/19 1743]  Enc Vitals  Group     BP 119/78     Pulse Rate 82     Resp 16     Temp 98 F (36.7 C)     Temp Source Skin     SpO2 100 %     Weight      Height      Head Circumference      Peak Flow      Pain Score 2     Pain Loc      Pain Edu?      Excl. in [place]?    No data found.  Updated Vital Signs BP 119/78 (BP Location: Right Arm)   Pulse 82   Temp 98 F (36.7 C) (Skin)   Resp 16   LMP 04/30/2019   SpO2 100%   Visual Acuity Right Eye Distance:   Left Eye Distance:   Bilateral Distance:    Right Eye Near:   Left Eye Near:    Bilateral Near:     Physical Exam Vitals signs and nursing note reviewed.  Constitutional:      General: She is not in acute distress.    Appearance: Normal appearance. She is not ill-appearing, toxic-appearing or diaphoretic.  HENT:     Head: Normocephalic.     Nose: Nose normal.     Mouth/Throat:     Pharynx: Oropharynx is clear.  Eyes:     Conjunctiva/sclera: Conjunctivae normal.  Neck:     Musculoskeletal: Normal range of motion.  Pulmonary:     Effort: Pulmonary effort is normal.  Abdominal:     Palpations: Abdomen is soft.     Tenderness: There is no abdominal tenderness.  Musculoskeletal: Normal range of motion.  Skin:    General: Skin is warm and dry.     Findings: No rash.  Neurological:     Mental Status: She is alert.  Psychiatric:        Mood and Affect: Mood normal.      UC Treatments / Results  Labs (all labs ordered are listed, but only abnormal results are displayed) Labs Reviewed  URINE CULTURE - Abnormal; Notable for the following components:      Result Value   Culture   (*)    Value: >=100,000 COLONIES/mL ESCHERICHIA COLI SUSCEPTIBILITIES TO FOLLOW Performed at [facility], [address], [place], [place] [address]    All other components within normal limits  POCT URINALYSIS DIP (DEVICE) - Abnormal; Notable for the following components:   Bilirubin Urine SMALL (*)    Hgb urine dipstick LARGE (*)  Protein,  ur >=300 (*)    Nitrite POSITIVE (*)    Leukocytes,Ua TRACE (*)    All other components within normal limits    EKG   Radiology No results found.  Procedures Procedures (including critical care time)  Medications Ordered in UC Medications - No data to display  Initial Impression / Assessment and Plan / UC Course  I have reviewed the triage vital signs and the nursing notes.  Pertinent labs & imaging results that were available during my care of the patient were reviewed by me and considered in my medical decision making (see chart for details).     Acute cystitis-treating with Keflex and recommended push fluids Urine sent for culture  Vaginal itching-treating with terconazole daily at bedtime   Final Clinical Impressions(s) / UC Diagnoses   Final diagnoses:  Acute cystitis with hematuria     Discharge Instructions     Treating you for a yeast infection and a UTI. Take the medication as prescribed.  Make sure you are drinking plenty of fluids to include water Follow up as needed for continued or worsening symptoms    ED Prescriptions    Medication Sig Dispense Auth. Provider   cephALEXin (KEFLEX) 500 MG capsule Take 1 capsule (500 mg total) by mouth 2 (two) times daily for 7 days. 14 capsule Francisco Eyerly A, NP   terconazole (TERAZOL 3) 0.8 % vaginal cream Place 1 applicator vaginally at bedtime. 20 g Dahlia ByesBast, Luisana Lutzke A, NP     PDMP not reviewed this encounter.   Janace ArisBast, Tyrion Glaude A, NP 05/21/19 1057

## 2019-05-22 LAB — URINE CULTURE: Culture: >=100000 — AB

## 2019-05-25 ENCOUNTER — Telehealth: Payer: Self-pay | Admitting: Emergency Medicine

## 2019-05-25 MED ORDER — NITROFURANTOIN MONOHYD MACRO 100 MG PO CAPS: 100.0000 mg | ORAL_CAPSULE | Freq: Two times a day (BID) | ORAL | 0 refills | Status: DC

## 2019-05-25 NOTE — Telephone Encounter (Signed)
Pt called stating the keflex was "making me dizzy". Per Claiborne Billings, okay to stop keflex and send macrobid BID x5 days.

## 2019-05-28 DIAGNOSIS — Z3689 Encounter for other specified antenatal screening: Secondary | ICD-10-CM | POA: Diagnosis not present

## 2019-05-28 DIAGNOSIS — Z3200 Encounter for pregnancy test, result unknown: Secondary | ICD-10-CM | POA: Diagnosis not present

## 2019-05-28 DIAGNOSIS — Z3A01 Less than 8 weeks gestation of pregnancy: Secondary | ICD-10-CM | POA: Diagnosis not present

## 2019-05-28 DIAGNOSIS — O09291 Supervision of pregnancy with other poor reproductive or obstetric history, first trimester: Secondary | ICD-10-CM | POA: Diagnosis not present

## 2019-05-30 DIAGNOSIS — Z3A01 Less than 8 weeks gestation of pregnancy: Secondary | ICD-10-CM | POA: Diagnosis not present

## 2019-05-30 DIAGNOSIS — O09291 Supervision of pregnancy with other poor reproductive or obstetric history, first trimester: Secondary | ICD-10-CM | POA: Diagnosis not present

## 2019-06-01 DIAGNOSIS — R300 Dysuria: Secondary | ICD-10-CM | POA: Diagnosis not present

## 2019-06-01 DIAGNOSIS — O09299 Supervision of pregnancy with other poor reproductive or obstetric history, unspecified trimester: Secondary | ICD-10-CM | POA: Diagnosis not present

## 2019-06-04 DIAGNOSIS — O09291 Supervision of pregnancy with other poor reproductive or obstetric history, first trimester: Secondary | ICD-10-CM | POA: Diagnosis not present

## 2019-06-04 DIAGNOSIS — Z3A01 Less than 8 weeks gestation of pregnancy: Secondary | ICD-10-CM | POA: Diagnosis not present

## 2019-06-06 DIAGNOSIS — Z3A08 8 weeks gestation of pregnancy: Secondary | ICD-10-CM | POA: Diagnosis not present

## 2019-06-06 DIAGNOSIS — Z3201 Encounter for pregnancy test, result positive: Secondary | ICD-10-CM | POA: Diagnosis not present

## 2019-06-06 DIAGNOSIS — O09291 Supervision of pregnancy with other poor reproductive or obstetric history, first trimester: Secondary | ICD-10-CM | POA: Diagnosis not present

## 2019-06-07 ENCOUNTER — Other Ambulatory Visit: Payer: Self-pay

## 2019-06-07 DIAGNOSIS — Z20822 Contact with and (suspected) exposure to covid-19: Secondary | ICD-10-CM

## 2019-06-11 LAB — NOVEL CORONAVIRUS, NAA: SARS-CoV-2, NAA: NOT DETECTED

## 2019-06-12 DIAGNOSIS — O09299 Supervision of pregnancy with other poor reproductive or obstetric history, unspecified trimester: Secondary | ICD-10-CM | POA: Diagnosis not present

## 2019-06-12 DIAGNOSIS — Z3A01 Less than 8 weeks gestation of pregnancy: Secondary | ICD-10-CM | POA: Diagnosis not present

## 2019-06-18 DIAGNOSIS — O09299 Supervision of pregnancy with other poor reproductive or obstetric history, unspecified trimester: Secondary | ICD-10-CM | POA: Diagnosis not present

## 2019-06-18 DIAGNOSIS — O039 Complete or unspecified spontaneous abortion without complication: Secondary | ICD-10-CM | POA: Diagnosis not present

## 2019-06-18 DIAGNOSIS — N96 Recurrent pregnancy loss: Secondary | ICD-10-CM | POA: Diagnosis not present

## 2019-06-18 DIAGNOSIS — K6289 Other specified diseases of anus and rectum: Secondary | ICD-10-CM | POA: Diagnosis not present

## 2019-06-22 DIAGNOSIS — N96 Recurrent pregnancy loss: Secondary | ICD-10-CM | POA: Diagnosis not present

## 2019-07-02 DIAGNOSIS — R5383 Other fatigue: Secondary | ICD-10-CM | POA: Diagnosis not present

## 2019-07-20 DIAGNOSIS — E039 Hypothyroidism, unspecified: Secondary | ICD-10-CM | POA: Diagnosis not present

## 2019-07-20 DIAGNOSIS — Z3A01 Less than 8 weeks gestation of pregnancy: Secondary | ICD-10-CM | POA: Diagnosis not present

## 2019-07-20 DIAGNOSIS — Z3200 Encounter for pregnancy test, result unknown: Secondary | ICD-10-CM | POA: Diagnosis not present

## 2019-07-20 DIAGNOSIS — Z3689 Encounter for other specified antenatal screening: Secondary | ICD-10-CM | POA: Diagnosis not present

## 2019-07-20 DIAGNOSIS — O26851 Spotting complicating pregnancy, first trimester: Secondary | ICD-10-CM | POA: Diagnosis not present

## 2019-07-27 DIAGNOSIS — Z3A01 Less than 8 weeks gestation of pregnancy: Secondary | ICD-10-CM | POA: Diagnosis not present

## 2019-07-27 DIAGNOSIS — O26859 Spotting complicating pregnancy, unspecified trimester: Secondary | ICD-10-CM | POA: Diagnosis not present

## 2019-07-27 DIAGNOSIS — O26851 Spotting complicating pregnancy, first trimester: Secondary | ICD-10-CM | POA: Diagnosis not present

## 2019-07-30 DIAGNOSIS — Z3A01 Less than 8 weeks gestation of pregnancy: Secondary | ICD-10-CM | POA: Diagnosis not present

## 2019-07-30 DIAGNOSIS — O26851 Spotting complicating pregnancy, first trimester: Secondary | ICD-10-CM | POA: Diagnosis not present

## 2019-08-01 DIAGNOSIS — O26859 Spotting complicating pregnancy, unspecified trimester: Secondary | ICD-10-CM | POA: Diagnosis not present

## 2019-08-01 DIAGNOSIS — Z3A08 8 weeks gestation of pregnancy: Secondary | ICD-10-CM | POA: Diagnosis not present

## 2019-08-02 DIAGNOSIS — O208 Other hemorrhage in early pregnancy: Secondary | ICD-10-CM | POA: Diagnosis not present

## 2019-08-02 DIAGNOSIS — O0289 Other abnormal products of conception: Secondary | ICD-10-CM | POA: Diagnosis not present

## 2019-08-02 DIAGNOSIS — Z3A01 Less than 8 weeks gestation of pregnancy: Secondary | ICD-10-CM | POA: Diagnosis not present

## 2019-08-03 DIAGNOSIS — O0281 Inappropriate change in quantitative human chorionic gonadotropin (hCG) in early pregnancy: Secondary | ICD-10-CM | POA: Diagnosis not present

## 2019-08-03 DIAGNOSIS — Z3A01 Less than 8 weeks gestation of pregnancy: Secondary | ICD-10-CM | POA: Diagnosis not present

## 2019-08-06 ENCOUNTER — Other Ambulatory Visit: Payer: BC Managed Care – PPO

## 2019-08-06 ENCOUNTER — Ambulatory Visit: Payer: BC Managed Care – PPO | Attending: Internal Medicine

## 2019-08-06 DIAGNOSIS — Z3A08 8 weeks gestation of pregnancy: Secondary | ICD-10-CM | POA: Diagnosis not present

## 2019-08-06 DIAGNOSIS — O280 Abnormal hematological finding on antenatal screening of mother: Secondary | ICD-10-CM | POA: Diagnosis not present

## 2019-08-06 DIAGNOSIS — Z20822 Contact with and (suspected) exposure to covid-19: Secondary | ICD-10-CM

## 2019-08-06 DIAGNOSIS — E039 Hypothyroidism, unspecified: Secondary | ICD-10-CM | POA: Diagnosis not present

## 2019-08-07 ENCOUNTER — Encounter (HOSPITAL_COMMUNITY): Payer: Self-pay | Admitting: Emergency Medicine

## 2019-08-07 ENCOUNTER — Emergency Department (HOSPITAL_COMMUNITY)
Admission: EM | Admit: 2019-08-07 | Discharge: 2019-08-07 | Disposition: A | Discharge status: Home / Self Care | Payer: BC Managed Care – PPO | Attending: Emergency Medicine | Admitting: Emergency Medicine

## 2019-08-07 ENCOUNTER — Other Ambulatory Visit: Payer: Self-pay

## 2019-08-07 DIAGNOSIS — R10819 Abdominal tenderness, unspecified site: Secondary | ICD-10-CM | POA: Diagnosis not present

## 2019-08-07 DIAGNOSIS — O021 Missed abortion: Secondary | ICD-10-CM | POA: Diagnosis not present

## 2019-08-07 DIAGNOSIS — Z79899 Other long term (current) drug therapy: Secondary | ICD-10-CM | POA: Insufficient documentation

## 2019-08-07 DIAGNOSIS — O0281 Inappropriate change in quantitative human chorionic gonadotropin (hCG) in early pregnancy: Secondary | ICD-10-CM | POA: Diagnosis not present

## 2019-08-07 DIAGNOSIS — R42 Dizziness and giddiness: Secondary | ICD-10-CM | POA: Diagnosis not present

## 2019-08-07 LAB — BASIC METABOLIC PANEL
Anion gap: 11 (ref 5–15)
BUN: 9 mg/dL (ref 6–20)
CO2: 23 mmol/L (ref 22–32)
Calcium: 9.2 mg/dL (ref 8.9–10.3)
Chloride: 105 mmol/L (ref 98–111)
Creatinine, Ser: 0.77 mg/dL (ref 0.44–1.00)
GFR calc Af Amer: >60 mL/min (ref >60)
GFR calc non Af Amer: >60 mL/min (ref >60)
Glucose, Bld: 99 mg/dL (ref 70–99)
Potassium: 3.6 mmol/L (ref 3.5–5.1)
Sodium: 139 mmol/L (ref 135–145)

## 2019-08-07 LAB — URINALYSIS, ROUTINE W REFLEX MICROSCOPIC
Bacteria, UA: NONE SEEN
Bilirubin Urine: NEGATIVE
Glucose, UA: NEGATIVE mg/dL
Ketones, ur: NEGATIVE mg/dL
Leukocytes,Ua: NEGATIVE
Nitrite: NEGATIVE
Protein, ur: NEGATIVE mg/dL
Specific Gravity, Urine: 1.014 (ref 1.005–1.030)
pH: 7 (ref 5.0–8.0)

## 2019-08-07 LAB — CBC
HCT: 35.4 % — ABNORMAL LOW (ref 36.0–46.0)
Hemoglobin: 11.8 g/dL — ABNORMAL LOW (ref 12.0–15.0)
MCH: 31.1 pg (ref 26.0–34.0)
MCHC: 33.3 g/dL (ref 30.0–36.0)
MCV: 93.4 fL (ref 80.0–100.0)
Platelets: 249 10*3/uL (ref 150–400)
RBC: 3.79 MIL/uL — ABNORMAL LOW (ref 3.87–5.11)
RDW: 12.4 % (ref 11.5–15.5)
WBC: 7.1 10*3/uL (ref 4.0–10.5)
nRBC: 0 % (ref 0.0–0.2)

## 2019-08-07 LAB — I-STAT BETA HCG BLOOD, ED (MC, WL, AP ONLY): I-stat hCG, quantitative: 981.7 m[IU]/mL — ABNORMAL HIGH (ref <5)

## 2019-08-07 LAB — CBG MONITORING, ED: Glucose-Capillary: 96 mg/dL (ref 70–99)

## 2019-08-07 LAB — NOVEL CORONAVIRUS, NAA: SARS-CoV-2, NAA: NOT DETECTED

## 2019-08-07 MED ORDER — SODIUM CHLORIDE 0.9% FLUSH: 3.0000 mL | Freq: Once | INTRAVENOUS | Status: DC

## 2019-08-07 MED ORDER — SODIUM CHLORIDE 0.9 % IV BOLUS
1000.0000 mL | Freq: Once | INTRAVENOUS | Status: AC
  Administered 2019-08-07: 22:00:00 1000 mL via INTRAVENOUS

## 2019-08-07 NOTE — ED Provider Notes (Signed)
[facility] EMERGENCY DEPARTMENT Provider Note   CSN: [id number] Arrival date & time: 08/07/19  1747     History Chief Complaint  Patient presents with  . Dizziness    Alison Turner is a 29 y.o. female past medical history of kidney stones, traumatic injury during pregnancy who presents today for evaluation of lightheadedness, nausea, dizziness, generalized weakness, fatigue that began after having a D&C.  She reports that about 4 PM this evening, she had a D&C.  She reports that she had Toradol and lidocaine that was intravaginally given.  She reports that during the procedure, she started feeling lightheaded like she was going to pass out.  She also felt like she was flushed and red all over.  She states she felt very itchy almost like she had a rash.  Afterwards, she felt sweaty, nauseous, lightheaded.  She felt disoriented and was very lightheaded and felt like she was going to pass out.  She states they did observe her at the outpatient clinic and had cleared her for discharge.  She was still having some symptoms so she came to the ED for further evaluation.  He does report that while she was on the waiting room, she still felt some of the lightheadedness sensation like she was going to pass out.  When placed into her room, she does report feeling somewhat better though she still feels slightly lightheaded when getting up.  She states that the red-like rash area has improved.  She denies any chest pain, shortness of breath.  She has had some slight abdominal cramping consistent with D&C but nothing worse.  She states she has had some slight light vaginal spotting but no obvious vaginal bleeding.  She denies any vision changes, numbness/weakness of arms or legs, vomiting.  The history is provided by the patient.       Past Medical History:  Diagnosis Date  . Headache   . Kidney calculi   . Traumatic injury during pregnancy in third trimester 06/19/2015     Patient Active Problem List   Diagnosis Date Noted  . Maternal anemia, with delivery 01/14/2018  . SVD 7/12 01/13/2018  . Postpartum care following vaginal delivery (7/12) 07/17/2015  . Traumatic injury during pregnancy in third trimester 06/19/2015    Past Surgical History:  Procedure Laterality Date  . MOUTH SURGERY       OB History    Gravida  3   Para  1   Term      Preterm  1   AB  1   Living  1     SAB  1   TAB      Ectopic      Multiple  0   Live Births  1           Family History  Problem Relation Age of Onset  . Hypertension Mother   . Kidney disease Father   . Cancer Maternal Grandmother   . Diabetes Maternal Grandmother   . Rheum arthritis Maternal Grandmother   . Kidney disease Maternal Grandfather   . Heart disease Maternal Grandfather   . Stroke Maternal Grandfather   . Rheum arthritis Maternal Grandfather   . Hypertension Paternal Grandmother   . Heart disease Paternal Grandmother   . Stroke Paternal Grandmother   . Rheum arthritis Paternal Grandmother   . Kidney disease Paternal Grandfather   . Heart disease Paternal Grandfather   . Hypertension Paternal Grandfather   . Rheum arthritis Paternal  Grandfather   . Healthy Sister   . Healthy Brother   . Healthy Sister   . Healthy Sister   . Healthy Sister   . Healthy Brother   . Healthy Brother     Social History   Tobacco Use  . Smoking status: Never Smoker  . Smokeless tobacco: Never Used  Substance Use Topics  . Alcohol use: No    Comment: Occasionally  . Drug use: No    Home Medications Prior to Admission medications   Medication Sig Start Date End Date Taking? Authorizing Provider  Ascorbic Acid (VITAMIN C PO) Take 1 tablet by mouth daily.   Yes [provider]  Aspirin-Acetaminophen (GOODYS BODY PAIN PO) Take 1 packet by mouth as needed (pain).   Yes [provider]  CRANBERRY PO Take 1 tablet by mouth daily.   Yes [provider]   FOLIC ACID PO Take 1 tablet by mouth daily.   Yes [provider]  ibuprofen (ADVIL) 200 MG tablet Take 400-600 mg by mouth every 6 (six) hours as needed for headache or moderate pain.   Yes [provider]  Multiple Vitamins-Minerals (ZINC PO) Take 1 tablet by mouth daily.   Yes [provider]  acetaminophen (TYLENOL) 325 MG tablet Take 2 tablets (650 mg total) by mouth every 4 (four) hours as needed (for pain scale < 4). Patient not taking: Reported on 08/07/2019 01/15/18   Neta Mends, CNM  benzocaine-Menthol (DERMOPLAST) 20-0.5 % AERO Apply 1 application topically as needed for irritation (perineal discomfort). Patient not taking: Reported on 08/07/2019 01/15/18   Neta Mends, CNM  coconut oil OIL Apply 1 application topically as needed. Patient not taking: Reported on 08/07/2019 01/15/18   Neta Mends, CNM  doxycycline (VIBRAMYCIN) 100 MG capsule Take 100 mg by mouth 2 (two) times daily. 08/07/19   [provider]  ibuprofen (ADVIL,MOTRIN) 600 MG tablet Take 1 tablet (600 mg total) by mouth every 6 (six) hours. Patient not taking: Reported on 08/07/2019 01/15/18   Neta Mends, CNM  iron polysaccharides (NIFEREX) 150 MG capsule Take 1 capsule (150 mg total) by mouth daily. Patient not taking: Reported on 08/07/2019 01/15/18   Neta Mends, CNM  magnesium oxide (MAG-OX) 400 (241.3 Mg) MG tablet Take 1 tablet (400 mg total) by mouth daily. Patient not taking: Reported on 08/07/2019 01/15/18   Neta Mends, CNM  terconazole (TERAZOL 3) 0.8 % vaginal cream Place 1 applicator vaginally at bedtime. Patient not taking: Reported on 08/07/2019 05/19/19   Janace Aris, NP    Allergies    Patient has no known allergies.  Review of Systems   Review of Systems  Constitutional: Positive for diaphoresis and fatigue. Negative for fever.  Respiratory: Negative for cough and shortness of breath.   Cardiovascular: Negative for chest pain.  Gastrointestinal:  Negative for abdominal pain, nausea and vomiting.  Genitourinary: Negative for dysuria and hematuria.  Skin: Positive for rash.  Neurological: Positive for dizziness, weakness (generalized) and light-headedness. Negative for numbness and headaches.  All other systems reviewed and are negative.   Physical Exam Updated Vital Signs BP 102/71   Pulse 79   Temp 98.1 F (36.7 C) (Oral)   Resp 13   SpO2 100%   Physical Exam Vitals and nursing note reviewed.  Constitutional:      Appearance: Normal appearance. She is well-developed.  HENT:     Head: Normocephalic and atraumatic.  Eyes:     General:  Lids are normal.     Conjunctiva/sclera: Conjunctivae normal.     Pupils: Pupils are equal, round, and reactive to light.     Comments: PERRL. EOMs intact. No nystagmus. No neglect.   Cardiovascular:     Rate and Rhythm: Normal rate and regular rhythm.     Pulses: Normal pulses.          Radial pulses are 2+ on the right side and 2+ on the left side.     Heart sounds: Normal heart sounds. No murmur. No friction rub. No gallop.   Pulmonary:     Effort: Pulmonary effort is normal.     Breath sounds: Normal breath sounds.     Comments: Lungs clear to auscultation bilaterally.  Symmetric chest rise.  No wheezing, rales, rhonchi. Abdominal:     Palpations: Abdomen is soft. Abdomen is not rigid.     Tenderness: There is no abdominal tenderness. There is no guarding.     Comments: Abdomen is soft, non-distended, non-tender. No rigidity, No guarding. No peritoneal signs.  Musculoskeletal:        General: Normal range of motion.     Cervical back: Full passive range of motion without pain.  Skin:    General: Skin is warm and dry.     Capillary Refill: Capillary refill takes less than 2 seconds.  Neurological:     Mental Status: She is alert and oriented to person, place, and time.     Comments: Cranial nerves III-XII intact Follows commands, Moves all extremities  5/5 strength to BUE and  BLE  Sensation intact throughout all major nerve distributions Normal finger to nose. No dysdiadochokinesia. No pronator drift. No gait abnormalities  No slurred speech. No facial droop.  Alert and oriented x3.  Psychiatric:        Speech: Speech normal.     ED Results / Procedures / Treatments   Labs (all labs ordered are listed, but only abnormal results are displayed) Labs Reviewed  CBC - Abnormal; Notable for the following components:      Result Value   RBC 3.79 (*)    Hemoglobin 11.8 (*)    HCT 35.4 (*)    All other components within normal limits  URINALYSIS, ROUTINE W REFLEX MICROSCOPIC - Abnormal; Notable for the following components:   Hgb urine dipstick SMALL (*)    All other components within normal limits  I-STAT BETA HCG BLOOD, ED ([facility], [facility], [facility] ONLY) - Abnormal; Notable for the following components:   I-stat hCG, quantitative 981.7 (*)    All other components within normal limits  BASIC METABOLIC PANEL  PREGNANCY, URINE  CBG MONITORING, ED    EKG EKG Interpretation  Date/Time:  Tuesday August 07 2019 18:00:49 EST Ventricular Rate:  94 PR Interval:  138 QRS Duration: 90 QT Interval:  334 QTC Calculation: 417 R Axis:   87 Text Interpretation: Normal sinus rhythm with sinus arrhythmia Cannot rule out Anterior infarct , age undetermined Abnormal ECG Confirmed by Raeford Razor [phone]) on 08/07/2019 11:40:55 PM   Radiology No results found.  Procedures Procedures (including critical care time)  Medications Ordered in ED Medications  sodium chloride flush (NS) 0.9 % injection 3 mL (has no administration in time range)  sodium chloride 0.9 % bolus 1,000 mL (1,000 mLs Intravenous New Bag/Given 08/07/19 2221)    ED Course  I have reviewed the triage vital signs and the nursing notes.  Pertinent labs & imaging results that were available during my care  of the patient were reviewed by me and considered in my medical decision making (see chart for  details).    MDM Rules/Calculators/A&P                      29 year old female who presents for evaluation of lightheadedness, dizziness, generalized weakness, nausea, lightheadedness sensation that began after getting a D&C at about 4 PM.  Does report some improvement since being here in the ED but still feels slightly lightheaded.  Most of her symptoms have resolved.  On initial ED arrival, she is afebrile, nontoxic-appearing.  Vital signs are stable though her blood pressure is Soft.  On exam, she has no neuro deficits.  She is alert and oriented x3 and able to answer all questions appropriately.  Question if this is orthostatic hypotension versus pain versus medication reaction.  Labs ordered at triage.  Will plan for orthostatic vital signs and fluids.  UA negative for any infection.  She has small amount of hemoglobin noted.  BMP is unremarkable.  CBC shows no leukocytosis.  Hemoglobin stable at 11.8.  I-STAT beta is 91.7.  Orthostatic VS for the past 24 hrs:  BP- Lying Pulse- Lying BP- Sitting Pulse- Sitting BP- Standing at 0 minutes Pulse- Standing at 0 minutes  08/07/19 2211 100/67 78 105/69 80 101/85 90    Reevaluation.  Patient blood pressure is improved after liter bolus here in ED.  Patient reports feeling better.  She has tolerated p.o.  I personally ambulated patient with no signs of ataxia.  Patient states she does not feel lightheaded or dizzy while walking.  I suspect this was likely related to her procedure versus medication.  At this time, her symptoms are not concerning for ACS etiology, CVA.  Instructed her to take rest precautions as directed by her OB/GYN. At this time, patient exhibits no emergent life-threatening condition that require further evaluation in ED or admission. Patient had ample opportunity for questions and discussion. All patient's questions were answered with full understanding. Strict return precautions discussed. Patient expresses understanding and agreement  to plan.   Portions of this note were generated with [employer]. Dictation errors may occur despite best attempts at proofreading.   Final Clinical Impression(s) / ED Diagnoses Final diagnoses:  Lightheadedness  Dizziness    Rx / DC Orders ED Discharge Orders    None       Desma Mcgregor 08/07/19 2348    Virgel Manifold, MD 08/08/19 [phone]

## 2019-08-07 NOTE — ED Notes (Signed)
Provided pt with a [place] bag and cup of iced water

## 2019-08-07 NOTE — Discharge Instructions (Signed)
As we discussed, your work-up today was reassuring.   Your symptoms could be caused by a number of different things: Medication reaction, pain for procedure, hypotension.  Make sure you are getting plenty of rest and getting plenty of fluids.  Please follow-up with your OB/GYN as directed.  Return the emergency department for any worsening symptoms, difficulty walking, abdominal pain, vomiting, chest pain or any other severe concerning symptoms.

## 2019-08-07 NOTE — ED Triage Notes (Signed)
Pt arrives to ED with c/c of dizziness pt states she had a d/c earlier today at an outpatient clinic and now is having dizziness and "feeling like she is having a reaction to the medication".

## 2019-08-08 ENCOUNTER — Other Ambulatory Visit: Payer: Self-pay | Admitting: Obstetrics & Gynecology

## 2019-08-09 ENCOUNTER — Other Ambulatory Visit: Payer: Self-pay | Admitting: Obstetrics & Gynecology

## 2019-08-09 ENCOUNTER — Encounter (HOSPITAL_COMMUNITY): Payer: Self-pay | Admitting: Obstetrics & Gynecology

## 2019-08-09 ENCOUNTER — Inpatient Hospital Stay (HOSPITAL_COMMUNITY)
Admission: AD | Admit: 2019-08-09 | Discharge: 2019-08-09 | Disposition: A | Discharge status: Home / Self Care | Payer: BC Managed Care – PPO | Attending: Obstetrics & Gynecology | Admitting: Obstetrics & Gynecology

## 2019-08-09 ENCOUNTER — Other Ambulatory Visit: Payer: Self-pay

## 2019-08-09 DIAGNOSIS — O00101 Right tubal pregnancy without intrauterine pregnancy: Secondary | ICD-10-CM

## 2019-08-09 DIAGNOSIS — O280 Abnormal hematological finding on antenatal screening of mother: Secondary | ICD-10-CM | POA: Diagnosis not present

## 2019-08-09 HISTORY — DX: Anemia, unspecified: D64.9

## 2019-08-09 HISTORY — DX: Calculus of kidney: N20.0

## 2019-08-09 HISTORY — DX: Hypothyroidism, unspecified: E03.9

## 2019-08-09 LAB — COMPREHENSIVE METABOLIC PANEL
ALT: 14 U/L (ref 0–44)
AST: 16 U/L (ref 15–41)
Albumin: 4.4 g/dL (ref 3.5–5.0)
Alkaline Phosphatase: 55 U/L (ref 38–126)
Anion gap: 9 (ref 5–15)
BUN: 8 mg/dL (ref 6–20)
CO2: 25 mmol/L (ref 22–32)
Calcium: 9.2 mg/dL (ref 8.9–10.3)
Chloride: 105 mmol/L (ref 98–111)
Creatinine, Ser: 0.7 mg/dL (ref 0.44–1.00)
GFR calc Af Amer: >60 mL/min (ref >60)
GFR calc non Af Amer: >60 mL/min (ref >60)
Glucose, Bld: 92 mg/dL (ref 70–99)
Potassium: 3.9 mmol/L (ref 3.5–5.1)
Sodium: 139 mmol/L (ref 135–145)
Total Bilirubin: 0.7 mg/dL (ref 0.3–1.2)
Total Protein: 6.8 g/dL (ref 6.5–8.1)

## 2019-08-09 LAB — CBC WITH DIFFERENTIAL/PLATELET
Abs Immature Granulocytes: 0.01 10*3/uL (ref 0.00–0.07)
Basophils Absolute: 0 10*3/uL (ref 0.0–0.1)
Basophils Relative: 1 %
Eosinophils Absolute: 0.1 10*3/uL (ref 0.0–0.5)
Eosinophils Relative: 1 %
HCT: 35.4 % — ABNORMAL LOW (ref 36.0–46.0)
Hemoglobin: 11.8 g/dL — ABNORMAL LOW (ref 12.0–15.0)
Immature Granulocytes: 0 %
Lymphocytes Relative: 31 %
Lymphs Abs: 1.2 10*3/uL (ref 0.7–4.0)
MCH: 31.3 pg (ref 26.0–34.0)
MCHC: 33.3 g/dL (ref 30.0–36.0)
MCV: 93.9 fL (ref 80.0–100.0)
Monocytes Absolute: 0.2 10*3/uL (ref 0.1–1.0)
Monocytes Relative: 5 %
Neutro Abs: 2.4 10*3/uL (ref 1.7–7.7)
Neutrophils Relative %: 62 %
Platelets: 223 10*3/uL (ref 150–400)
RBC: 3.77 MIL/uL — ABNORMAL LOW (ref 3.87–5.11)
RDW: 12.4 % (ref 11.5–15.5)
WBC: 3.9 10*3/uL — ABNORMAL LOW (ref 4.0–10.5)
nRBC: 0 % (ref 0.0–0.2)

## 2019-08-09 LAB — HCG, QUANTITATIVE, PREGNANCY: hCG, Beta Chain, Quant, S: 1213 m[IU]/mL — ABNORMAL HIGH (ref <5)

## 2019-08-09 MED ORDER — METHOTREXATE FOR ECTOPIC PREGNANCY
50.0000 mg/m2 | Freq: Once | INTRAMUSCULAR | Status: AC
  Administered 2019-08-09: 75 mg via INTRAMUSCULAR
  Filled 2019-08-09: qty 1

## 2019-08-09 NOTE — Progress Notes (Signed)
Lab results called to Dr Juliene Pina.  Orders rec'd to proceed with ordered Methotrexate with precautions & instruct pt to return to [facility] on Sunday 2/7 for repeat HCG & keep scheduled appt with MD on Weds 2/10.

## 2019-08-09 NOTE — Discharge Instructions (Signed)
Methotrexate Treatment for an Ectopic Pregnancy  Methotrexate is a medicine that treats an ectopic pregnancy. An ectopic pregnancy is a pregnancy in which the fetus develops outside the uterus. This kind of pregnancy can be dangerous. Methotrexate works by stopping the growth of the fertilized egg. It also helps your body absorb tissue from the egg. This takes between 2-6 weeks. Most ectopic pregnancies can be successfully treated with methotrexate if they are diagnosed early. Tell a health care provider about:  Any allergies you have.  All medicines you are taking, including vitamins, herbs, eye drops, creams, and over-the-counter medicines.  Any medical conditions you have. What are the risks? Generally, this is a safe treatment. However, problems may occur, including:  Nausea or vomiting or both.  Vaginal bleeding or spotting.  Diarrhea.  Abdominal cramping.  Dizziness or feeling lightheaded.  Mouth sores.  Swelling or irritation of the lining of your lungs (pneumonitis).  Liver damage.  Hair loss. There is a risk that methotrexate treatment will fail and your pregnancy will continue. There is also a risk that the ectopic pregnancy might rupture while you are using this medicine. What happens before the procedure?  Liver tests, kidney tests, and a complete blood test will be done.  Blood tests will be done to measure the pregnancy hormone levels and to determine your blood type.  If you are Rh-negative and the father is Rh-positive or his Rh type is not known, you will be given a Rho (D) immune globulin shot. What happens during the procedure? Your health care provider may give you methotrexate by injection or in the form of a pill. Methotrexate may be given as a single dose of medicine or a series of doses, depending on your response to the treatment.  Methotrexate injections will be given by your health care provider. This is the most common way that methotrexate is used  to treat an ectopic pregnancy.  If you are prescribed oral methotrexate, it is very important that you follow your health care provider's instructions on how to take oral methotrexate. Additional medicines may be needed to manage an ectopic pregnancy. The procedure may vary among health care providers and hospitals. What happens after the procedure?  You may have abdominal cramping, vaginal bleeding, and fatigue.  Blood tests will be taken at timed intervals for several days or weeks to check your pregnancy hormone levels. The blood tests will be done until the pregnancy hormone can no longer be detected in the blood.  You may need to have a surgical procedure to remove the ectopic pregnancy if methotrexate treatment fails.  Follow instructions from your health care provider on how and when to report any symptoms that may indicate a ruptured ectopic pregnancy. Summary  Methotrexate is a medicine that treats an ectopic pregnancy.  Methotrexate may be given in a single dose or a series of doses over time.  Blood tests will be taken at timed intervals for several days or weeks to check your pregnancy hormone levels. The blood tests will be done until no more pregnancy hormone is detected in the blood.  There is a risk that methotrexate treatment will fail and your pregnancy will continue. There is also a risk that the ectopic pregnancy might rupture while you are using this medicine. This information is not intended to replace advice given to you by your health care provider. Make sure you discuss any questions you have with your health care provider. Document Revised: 06/03/2017 Document Reviewed: 08/10/2016 Elsevier Patient   Education  2020 [employer]  

## 2019-08-09 NOTE — [facility] Note (Signed)
Pt sent from office to f/u to ectopic pregnancy diagnosed via u/s in MD office today.  Pt for f/u labs & methotrexate injection.

## 2019-08-12 ENCOUNTER — Inpatient Hospital Stay (HOSPITAL_COMMUNITY)
Admission: AD | Admit: 2019-08-12 | Discharge: 2019-08-13 | Disposition: A | Discharge status: Home / Self Care | Payer: BC Managed Care – PPO | Attending: Obstetrics | Admitting: Obstetrics

## 2019-08-12 ENCOUNTER — Other Ambulatory Visit (HOSPITAL_COMMUNITY)
Admission: AD | Admit: 2019-08-12 | Discharge: 2019-08-12 | Disposition: A | Discharge status: Home / Self Care | Payer: BC Managed Care – PPO | Source: Ambulatory Visit | Attending: Obstetrics & Gynecology | Admitting: Obstetrics & Gynecology

## 2019-08-12 ENCOUNTER — Encounter (HOSPITAL_COMMUNITY): Payer: Self-pay | Admitting: Obstetrics

## 2019-08-12 ENCOUNTER — Inpatient Hospital Stay (HOSPITAL_COMMUNITY)
Admission: AD | Admit: 2019-08-12 | Discharge: 2019-08-12 | Disposition: A | Discharge status: Home / Self Care | Payer: BC Managed Care – PPO | Attending: Obstetrics and Gynecology | Admitting: Obstetrics and Gynecology

## 2019-08-12 ENCOUNTER — Other Ambulatory Visit: Payer: Self-pay

## 2019-08-12 ENCOUNTER — Inpatient Hospital Stay (HOSPITAL_COMMUNITY): Payer: BC Managed Care – PPO

## 2019-08-12 DIAGNOSIS — Z3A00 Weeks of gestation of pregnancy not specified: Secondary | ICD-10-CM | POA: Diagnosis not present

## 2019-08-12 DIAGNOSIS — Z87891 Personal history of nicotine dependence: Secondary | ICD-10-CM | POA: Insufficient documentation

## 2019-08-12 DIAGNOSIS — R42 Dizziness and giddiness: Secondary | ICD-10-CM | POA: Diagnosis not present

## 2019-08-12 DIAGNOSIS — T50905A Adverse effect of unspecified drugs, medicaments and biological substances, initial encounter: Secondary | ICD-10-CM

## 2019-08-12 DIAGNOSIS — O009 Unspecified ectopic pregnancy without intrauterine pregnancy: Secondary | ICD-10-CM | POA: Diagnosis not present

## 2019-08-12 LAB — CBC
HCT: 33 % — ABNORMAL LOW (ref 36.0–46.0)
Hemoglobin: 11.3 g/dL — ABNORMAL LOW (ref 12.0–15.0)
MCH: 31.3 pg (ref 26.0–34.0)
MCHC: 34.2 g/dL (ref 30.0–36.0)
MCV: 91.4 fL (ref 80.0–100.0)
Platelets: 243 10*3/uL (ref 150–400)
RBC: 3.61 MIL/uL — ABNORMAL LOW (ref 3.87–5.11)
RDW: 11.9 % (ref 11.5–15.5)
WBC: 4.7 10*3/uL (ref 4.0–10.5)
nRBC: 0 % (ref 0.0–0.2)

## 2019-08-12 LAB — HCG, QUANTITATIVE, PREGNANCY: hCG, Beta Chain, Quant, S: 1232 m[IU]/mL — ABNORMAL HIGH (ref <5)

## 2019-08-12 MED ORDER — SCOPOLAMINE 1 MG/3DAYS TD PT72: 1.0000 | MEDICATED_PATCH | TRANSDERMAL | Status: DC

## 2019-08-12 NOTE — MAU Provider Note (Addendum)
History     CSN: [id number]  Arrival date and time: 08/12/19 2012   First Provider Initiated Contact with Patient 08/12/19 2100      Chief Complaint  Patient presents with  . Dizziness   29 y.o. [id number] with known ectopic presenting with dizziness. Recent hx obtained by pt and chart. Pt had ipass procedure in office on 08/07/19, shortly after the procedure the pt presented to [facility] with dizziness and lightheadedness. She was found to be hypotensive but otherwise had negative workup. She was given IVF and observed then later discharged home. She presented to the office 2 days later and was found to have a right ectopic pregnancy and was treated with MTX on 08/09/19.  After the MTX she had severe nausea until yesterday then began feeling better last night. Today she started to experience intermittent dizzy episodes lasting a few seconds, feels "like I'm on a moving boat". No syncope. She is eating and drinking well. Reports minimal low abdominal cramping and vaginal spotting which has been ongoing for several days. States "I have not been right since the ipass procedure when I got the Lidocaine". Reports hx of increased sensitivity to meds.   OB History    Gravida  6   Para  2   Term  1   Preterm  1   AB  3   Living  2     SAB  3   TAB      Ectopic  0   Multiple  0   Live Births  2           Past Medical History:  Diagnosis Date  . Anemia   . Headache   . Hypothyroidism    has resolved  . Kidney calculi   . Kidney stones   . Traumatic injury during pregnancy in third trimester 06/19/2015    Past Surgical History:  Procedure Laterality Date  . MOUTH SURGERY      Family History  Problem Relation Age of Onset  . Hypertension Mother   . Kidney disease Father   . Cancer Maternal Grandmother   . Diabetes Maternal Grandmother   . Rheum arthritis Maternal Grandmother   . Kidney disease Maternal Grandfather   . Heart disease Maternal Grandfather   . Stroke  Maternal Grandfather   . Rheum arthritis Maternal Grandfather   . Hypertension Paternal Grandmother   . Heart disease Paternal Grandmother   . Stroke Paternal Grandmother   . Rheum arthritis Paternal Grandmother   . Kidney disease Paternal Grandfather   . Heart disease Paternal Grandfather   . Hypertension Paternal Grandfather   . Rheum arthritis Paternal Grandfather   . Healthy Sister   . Healthy Brother   . Healthy Sister   . Healthy Sister   . Healthy Sister   . Healthy Brother   . Healthy Brother     Social History   Tobacco Use  . Smoking status: Former Smoker    Types: Pipe, Software engineer  . Smokeless tobacco: Never Used  Substance Use Topics  . Alcohol use: Yes    Comment: Occasionally  . Drug use: No    Allergies:  Allergies  Allergen Reactions  . Lidocaine Itching, Nausea Only and Other (See Comments)    Dizziness, flushed     No medications prior to admission.    Review of Systems  Constitutional: Negative for chills and fever.  Respiratory: Negative for shortness of breath.   Cardiovascular: Negative for chest pain.  Gastrointestinal: Positive  for abdominal pain.  Genitourinary: Positive for vaginal bleeding.  Neurological: Positive for dizziness. Negative for syncope and headaches.   Physical Exam   Blood pressure 113/68, pulse 72, temperature 99.1 F (37.3 C), resp. rate 15, weight 50.8 kg, last menstrual period 04/30/2019, SpO2 100 %, unknown if currently breastfeeding. Orthostatic VS for the past 24 hrs:  BP- Lying Pulse- Lying BP- Sitting Pulse- Sitting BP- Standing at 0 minutes Pulse- Standing at 0 minutes  08/12/19 2042 107/66 71 118/73 74 115/76 85   Patient Vitals for the past 24 hrs:  BP Temp Pulse Resp SpO2 Weight  08/13/19 0150 113/68 -- 72 15 100 % --  08/12/19 2059 -- 99.1 F (37.3 C) -- 15 -- --  08/12/19 2042 -- -- -- -- 100 % --  08/12/19 2034 -- -- -- -- -- 50.8 kg    Physical Exam  Nursing note and vitals  reviewed. Constitutional: She is oriented to person, place, and time. She appears well-developed and well-nourished. No distress.  HENT:  Head: Normocephalic and atraumatic.  Cardiovascular: Normal rate.  Respiratory: Effort normal. No respiratory distress.  GI: Soft. She exhibits no distension and no mass. There is no abdominal tenderness. There is no rebound and no guarding.  Musculoskeletal:        General: Normal range of motion.     Cervical back: Normal range of motion.  Neurological: She is alert and oriented to person, place, and time.  Skin: Skin is warm and dry.  Psychiatric: She has a normal mood and affect.   Results for orders placed or performed during the hospital encounter of 08/12/19 (from the past 24 hour(s))  CBC     Status: Abnormal   Collection Time: 08/12/19  9:45 PM  Result Value Ref Range   WBC 4.7 4.0 - 10.5 K/uL   RBC 3.61 (L) 3.87 - 5.11 MIL/uL   Hemoglobin 11.3 (L) 12.0 - 15.0 g/dL   HCT 33.0 (L) 36.0 - 46.0 %   MCV 91.4 80.0 - 100.0 fL   MCH 31.3 26.0 - 34.0 pg   MCHC 34.2 30.0 - 36.0 g/dL   RDW 11.9 11.5 - 15.5 %   Platelets 243 150 - 400 K/uL   nRBC 0.0 0.0 - 0.2 %   US OB Comp Less 14 Wks  Result Date: 08/12/2019 CLINICAL DATA:  Known ectopic, near syncope, methotrexate administered 3 days prior, beta HCG 1232 EXAM: OBSTETRIC <14 WK [name] AND TRANSVAGINAL OB US TECHNIQUE: Both transabdominal and transvaginal ultrasound examinations were performed for complete evaluation of the gestation as well as the maternal uterus, adnexal regions, and pelvic cul-de-sac. Transvaginal technique was performed to assess early pregnancy. COMPARISON:  Prior gestational ultrasound 03/18/2017, reported ultrasound from this gestation is available at the time of interpretation FINDINGS: Intrauterine gestational sac: None Yolk sac:  Not Visualized. Embryo:  Not Visualized. Cardiac Activity: Not Visualized. Maternal uterus/adnexae: Retroflexed uterus. No maternal uterine abnormality  is seen. Left ovary is unremarkable. Heterogeneously echogenic structure in the right ovary measuring 1.8 x 1.2 x 1.5 cm appears separate from the right ovarian parenchyma and is compatible with reported history of ectopic pregnancy. Small amount of minimally complex fluid is seen in the right adnexa. IMPRESSION: 1.8 cm heterogeneous structure in the right adnexa compatible reported history of ectopic pregnancy. Trace minimally complex free fluid may reflect a small amount of hemorrhage. Prior ultrasounds of this gestation are not available at the time of interpretation. Consider short-term follow-up ultrasound and trending quantitative beta hCG.  These results were called by telephone at the time of interpretation on 08/12/2019 at 11:22 pm to provider [facility] , who verbally acknowledged these results. Electronically Signed   By: Kreg Shropshire M.D.   On: 08/12/2019 23:22   US OB Transvaginal  Result Date: 08/12/2019 CLINICAL DATA:  Known ectopic, near syncope, methotrexate administered 3 days prior, beta HCG 1232 EXAM: OBSTETRIC <14 WK [name] AND TRANSVAGINAL OB US TECHNIQUE: Both transabdominal and transvaginal ultrasound examinations were performed for complete evaluation of the gestation as well as the maternal uterus, adnexal regions, and pelvic cul-de-sac. Transvaginal technique was performed to assess early pregnancy. COMPARISON:  Prior gestational ultrasound 03/18/2017, reported ultrasound from this gestation is available at the time of interpretation FINDINGS: Intrauterine gestational sac: None Yolk sac:  Not Visualized. Embryo:  Not Visualized. Cardiac Activity: Not Visualized. Maternal uterus/adnexae: Retroflexed uterus. No maternal uterine abnormality is seen. Left ovary is unremarkable. Heterogeneously echogenic structure in the right ovary measuring 1.8 x 1.2 x 1.5 cm appears separate from the right ovarian parenchyma and is compatible with reported history of ectopic pregnancy. Small amount of  minimally complex fluid is seen in the right adnexa. IMPRESSION: 1.8 cm heterogeneous structure in the right adnexa compatible reported history of ectopic pregnancy. Trace minimally complex free fluid may reflect a small amount of hemorrhage. Prior ultrasounds of this gestation are not available at the time of interpretation. Consider short-term follow-up ultrasound and trending quantitative beta hCG. These results were called by telephone at the time of interpretation on 08/12/2019 at 11:22 pm to provider [facility] , who verbally acknowledged these results. Electronically Signed   By: Kreg Shropshire M.D.   On: 08/12/2019 23:22   MAU Course  Procedures  MDM Labs and [name] ordered and reviewed. Quant HCG from earlier today 1232, last qhcg on day of MTX was 1213. Not orthostatic. Normal EKG. CBC stable. No evidence of ectopic rupture on [name]. Suspect dizziness is SE from MTX. Will Rx Meclizine for symptom mngt. Discussed presentation and clinical findings with Dr. Alysia Penna, agrees with plan. Stable for discharge home.  Assessment and Plan   1. Drug side effects, initial encounter   2. Ectopic pregnancy    Discharge home Follow up at [facility] this week as scheduled Return precautions Rx Meclizine  Allergies as of 08/13/2019      Reactions   Lidocaine Itching, Nausea Only, Other (See Comments)   Dizziness, flushed      Medication List    TAKE these medications   acetaminophen 325 MG tablet Commonly known as: Tylenol Take 2 tablets (650 mg total) by mouth every 4 (four) hours as needed (for pain scale < 4).   meclizine 25 MG tablet Commonly known as: ANTIVERT Take 1 tablet (25 mg total) by mouth 3 (three) times daily as needed for dizziness.      Donette Larry, CNM 08/13/2019, 7:33 AM

## 2019-08-12 NOTE — MAU Note (Signed)
Pt to be outpatient lab draw per Dr Cherly Hensen. Registration made aware.

## 2019-08-12 NOTE — MAU Note (Signed)
Patient states she has a known right ectopic that was diagnosed last Thursday-given Methotrexate shot on that day-approximately 6 weeks at that time.  She reports she was very sick since then until today.  Today started experiencing severe dizziness and felt as though she was going to pass out.  Reports she is still having some spotting and diffuse abdominal pain but nothing severe.

## 2019-08-13 MED ORDER — MECLIZINE HCL 25 MG PO TABS: 25.0000 mg | ORAL_TABLET | Freq: Three times a day (TID) | ORAL | 0 refills | Status: DC | PRN

## 2019-08-13 MED ORDER — MECLIZINE HCL 25 MG PO TABS
25.0000 mg | ORAL_TABLET | Freq: Three times a day (TID) | ORAL | Status: DC | PRN
  Administered 2019-08-13: 01:00:00 25 mg via ORAL
  Filled 2019-08-13 (×2): qty 1

## 2019-08-13 NOTE — Discharge Instructions (Signed)
Methotrexate Treatment for an Ectopic Pregnancy, Care After This sheet gives you information about how to care for yourself after your procedure. Your health care provider may also give you more specific instructions. If you have problems or questions, contact your health care provider. What can I expect after the procedure? After the procedure, it is common to have:  Abdominal cramping.  Vaginal bleeding.  Fatigue.  Nausea.  Vomiting.  Diarrhea. Blood tests will be taken at timed intervals for several days or weeks to check your pregnancy hormone levels. The blood tests will be done until the pregnancy hormone can no longer be detected in the blood. Follow these instructions at home: Activity  Do not have sex until your health care provider approves.  Limit activities that take a lot of effort as told by your health care provider. Medicines  Take over the counter and prescription medicines only as told by your health care provider.  Do not take aspirin, ibuprofen, naproxen, or any other NSAIDs.  Do not take folic acid, prenatal vitamins, or other vitamins that contain folic acid. General instructions   Do not drink alcohol.  Follow instructions from your health care provider on how and when to report any symptoms that may indicate a ruptured ectopic pregnancy.  Keep all follow-up visits as told by your health care provider. This is important. Contact a health care provider if:  You have persistent nausea and vomiting.  You have persistent diarrhea.  You are having a reaction to the medicine, such as: ? Tiredness. ? Skin rash. ? Hair loss. Get help right away if:  Your abdominal or pelvic pain gets worse.  You have more vaginal bleeding.  You feel light-headed or you faint.  You have shortness of breath.  Your heart rate increases.  You develop a cough.  You have chills.  You have a fever. Summary  After the procedure, it is common to have symptoms  of abdominal cramping, vaginal bleeding and fatigue. You may also experience other symptoms.  Blood tests will be taken at timed intervals for several days or weeks to check your pregnancy hormone levels. The blood tests will be done until the pregnancy hormone can no longer be detected in the blood.  Limit strenuous activity as told by your health care provider.  Follow instructions from your health care provider on how and when to report any symptoms that may indicate a ruptured ectopic pregnancy. This information is not intended to replace advice given to you by your health care provider. Make sure you discuss any questions you have with your health care provider. Document Revised: 06/03/2017 Document Reviewed: 08/10/2016 Elsevier Patient Education  2020 Else[employer] Inc.  

## 2019-08-15 DIAGNOSIS — O280 Abnormal hematological finding on antenatal screening of mother: Secondary | ICD-10-CM | POA: Diagnosis not present

## 2019-08-15 DIAGNOSIS — O00109 Unspecified tubal pregnancy without intrauterine pregnancy: Secondary | ICD-10-CM | POA: Diagnosis not present

## 2019-08-15 DIAGNOSIS — R42 Dizziness and giddiness: Secondary | ICD-10-CM | POA: Diagnosis not present

## 2019-08-15 DIAGNOSIS — Z3A01 Less than 8 weeks gestation of pregnancy: Secondary | ICD-10-CM | POA: Diagnosis not present

## 2019-08-20 ENCOUNTER — Inpatient Hospital Stay (HOSPITAL_COMMUNITY)
Admission: AD | Admit: 2019-08-20 | Discharge: 2019-08-20 | Disposition: A | Discharge status: Home / Self Care | Payer: BC Managed Care – PPO | Attending: Obstetrics and Gynecology | Admitting: Obstetrics and Gynecology

## 2019-08-20 ENCOUNTER — Other Ambulatory Visit: Payer: Self-pay

## 2019-08-20 ENCOUNTER — Encounter (HOSPITAL_COMMUNITY): Payer: Self-pay | Admitting: Obstetrics and Gynecology

## 2019-08-20 ENCOUNTER — Inpatient Hospital Stay (HOSPITAL_COMMUNITY): Payer: BC Managed Care – PPO

## 2019-08-20 DIAGNOSIS — Z884 Allergy status to anesthetic agent status: Secondary | ICD-10-CM | POA: Insufficient documentation

## 2019-08-20 DIAGNOSIS — R102 Pelvic and perineal pain: Secondary | ICD-10-CM | POA: Diagnosis not present

## 2019-08-20 DIAGNOSIS — Z833 Family history of diabetes mellitus: Secondary | ICD-10-CM | POA: Diagnosis not present

## 2019-08-20 DIAGNOSIS — Z6790 Unspecified blood type, Rh positive: Secondary | ICD-10-CM

## 2019-08-20 DIAGNOSIS — O4690 Antepartum hemorrhage, unspecified, unspecified trimester: Secondary | ICD-10-CM | POA: Diagnosis not present

## 2019-08-20 DIAGNOSIS — Z3A00 Weeks of gestation of pregnancy not specified: Secondary | ICD-10-CM | POA: Diagnosis not present

## 2019-08-20 DIAGNOSIS — O009 Unspecified ectopic pregnancy without intrauterine pregnancy: Secondary | ICD-10-CM | POA: Diagnosis not present

## 2019-08-20 DIAGNOSIS — Z809 Family history of malignant neoplasm, unspecified: Secondary | ICD-10-CM | POA: Insufficient documentation

## 2019-08-20 DIAGNOSIS — O99891 Other specified diseases and conditions complicating pregnancy: Secondary | ICD-10-CM | POA: Insufficient documentation

## 2019-08-20 DIAGNOSIS — N939 Abnormal uterine and vaginal bleeding, unspecified: Secondary | ICD-10-CM

## 2019-08-20 DIAGNOSIS — O26899 Other specified pregnancy related conditions, unspecified trimester: Secondary | ICD-10-CM | POA: Diagnosis not present

## 2019-08-20 DIAGNOSIS — Z87891 Personal history of nicotine dependence: Secondary | ICD-10-CM | POA: Diagnosis not present

## 2019-08-20 DIAGNOSIS — Z90721 Acquired absence of ovaries, unilateral: Secondary | ICD-10-CM | POA: Insufficient documentation

## 2019-08-20 DIAGNOSIS — O00201 Right ovarian pregnancy without intrauterine pregnancy: Secondary | ICD-10-CM

## 2019-08-20 LAB — COMPREHENSIVE METABOLIC PANEL
ALT: 12 U/L (ref 0–44)
AST: 18 U/L (ref 15–41)
Albumin: 4.2 g/dL (ref 3.5–5.0)
Alkaline Phosphatase: 58 U/L (ref 38–126)
Anion gap: 10 (ref 5–15)
BUN: 10 mg/dL (ref 6–20)
CO2: 26 mmol/L (ref 22–32)
Calcium: 9.2 mg/dL (ref 8.9–10.3)
Chloride: 103 mmol/L (ref 98–111)
Creatinine, Ser: 0.74 mg/dL (ref 0.44–1.00)
GFR calc Af Amer: >60 mL/min (ref >60)
GFR calc non Af Amer: >60 mL/min (ref >60)
Glucose, Bld: 111 mg/dL — ABNORMAL HIGH (ref 70–99)
Potassium: 4 mmol/L (ref 3.5–5.1)
Sodium: 139 mmol/L (ref 135–145)
Total Bilirubin: 0.6 mg/dL (ref 0.3–1.2)
Total Protein: 6.6 g/dL (ref 6.5–8.1)

## 2019-08-20 LAB — URINALYSIS, ROUTINE W REFLEX MICROSCOPIC
Bilirubin Urine: NEGATIVE
Glucose, UA: NEGATIVE mg/dL
Hgb urine dipstick: NEGATIVE
Ketones, ur: NEGATIVE mg/dL
Leukocytes,Ua: NEGATIVE
Nitrite: NEGATIVE
Protein, ur: NEGATIVE mg/dL
Specific Gravity, Urine: 1.019 (ref 1.005–1.030)
pH: 7 (ref 5.0–8.0)

## 2019-08-20 LAB — CBC
HCT: 34.4 % — ABNORMAL LOW (ref 36.0–46.0)
Hemoglobin: 11.9 g/dL — ABNORMAL LOW (ref 12.0–15.0)
MCH: 31.8 pg (ref 26.0–34.0)
MCHC: 34.6 g/dL (ref 30.0–36.0)
MCV: 92 fL (ref 80.0–100.0)
Platelets: 263 10*3/uL (ref 150–400)
RBC: 3.74 MIL/uL — ABNORMAL LOW (ref 3.87–5.11)
RDW: 12.5 % (ref 11.5–15.5)
WBC: 6.8 10*3/uL (ref 4.0–10.5)
nRBC: 0 % (ref 0.0–0.2)

## 2019-08-20 LAB — HCG, QUANTITATIVE, PREGNANCY: hCG, Beta Chain, Quant, S: 336 m[IU]/mL — ABNORMAL HIGH (ref <5)

## 2019-08-20 NOTE — Discharge Instructions (Signed)
Methotrexate Treatment for an Ectopic Pregnancy, Care After This sheet gives you information about how to care for yourself after your procedure. Your health care provider may also give you more specific instructions. If you have problems or questions, contact your health care provider. What can I expect after the procedure? After the procedure, it is common to have:  Abdominal cramping.  Vaginal bleeding.  Fatigue.  Nausea.  Vomiting.  Diarrhea. Blood tests will be taken at timed intervals for several days or weeks to check your pregnancy hormone levels. The blood tests will be done until the pregnancy hormone can no longer be detected in the blood. Follow these instructions at home: Activity  Do not have sex until your health care provider approves.  Limit activities that take a lot of effort as told by your health care provider. Medicines  Take over the counter and prescription medicines only as told by your health care provider.  Do not take aspirin, ibuprofen, naproxen, or any other NSAIDs.  Do not take folic acid, prenatal vitamins, or other vitamins that contain folic acid. General instructions   Do not drink alcohol.  Follow instructions from your health care provider on how and when to report any symptoms that may indicate a ruptured ectopic pregnancy.  Keep all follow-up visits as told by your health care provider. This is important. Contact a health care provider if:  You have persistent nausea and vomiting.  You have persistent diarrhea.  You are having a reaction to the medicine, such as: ? Tiredness. ? Skin rash. ? Hair loss. Get help right away if:  Your abdominal or pelvic pain gets worse.  You have more vaginal bleeding.  You feel light-headed or you faint.  You have shortness of breath.  Your heart rate increases.  You develop a cough.  You have chills.  You have a fever. Summary  After the procedure, it is common to have symptoms  of abdominal cramping, vaginal bleeding and fatigue. You may also experience other symptoms.  Blood tests will be taken at timed intervals for several days or weeks to check your pregnancy hormone levels. The blood tests will be done until the pregnancy hormone can no longer be detected in the blood.  Limit strenuous activity as told by your health care provider.  Follow instructions from your health care provider on how and when to report any symptoms that may indicate a ruptured ectopic pregnancy. This information is not intended to replace advice given to you by your health care provider. Make sure you discuss any questions you have with your health care provider. Document Revised: 06/03/2017 Document Reviewed: 08/10/2016 Elsevier Patient Education  2020 Else[employer] Inc.   Ectopic Pregnancy  An ectopic pregnancy is when the fertilized egg attaches (implants) outside the uterus. Most ectopic pregnancies occur in one of the tubes where eggs travel from the ovary to the uterus (fallopian tubes), but the implanting can occur in other locations. In rare cases, ectopic pregnancies occur on the ovary, intestine, pelvis, abdomen, or cervix. In an ectopic pregnancy, the fertilized egg does not have the ability to develop into a normal, healthy baby. A ruptured ectopic pregnancy is one in which tearing or bursting of a fallopian tube causes internal bleeding. Often, there is intense lower abdominal pain, and vaginal bleeding sometimes occurs. Having an ectopic pregnancy can be life-threatening. If this dangerous condition is not treated, it can lead to blood loss, shock, or even death. What are the causes? The most common cause  The most common cause of this condition is damage to one of the fallopian tubes. A fallopian tube may be narrowed or blocked, and that keeps the fertilized egg from reaching the uterus. What increases the risk? This condition is more likely to develop in women of childbearing age who have  different levels of risk. The levels of risk can be divided into three categories. High risk  You have gone through infertility treatment.  You have had an ectopic pregnancy before.  You have had surgery on the fallopian tubes, or another surgical procedure, such as an abortion.  You have had surgery to have the fallopian tubes tied (tubal ligation).  You have problems or diseases of the fallopian tubes.  You have been exposed to diethylstilbestrol (DES). This medicine was used until 1971, and it had effects on babies whose mothers took the medicine.  You become pregnant while using an IUD (intrauterine device) for birth control. Moderate risk  You have a history of infertility.  You have had an STI (sexually transmitted infection).  You have a history of pelvic inflammatory disease (PID).  You have scarring from endometriosis.  You have multiple sexual partners.  You smoke. Low risk  You have had pelvic surgery.  You use vaginal douches.  You became sexually active before age 18. What are the signs or symptoms? Common symptoms of this condition include normal pregnancy symptoms, such as missing a period, nausea, tiredness, abdominal pain, breast tenderness, and bleeding. However, ectopic pregnancy will have additional symptoms, such as:  Pain with intercourse.  Irregular vaginal bleeding or spotting.  Cramping or pain on one side or in the lower abdomen.  Fast heartbeat, low blood pressure, and sweating.  Passing out while having a bowel movement. Symptoms of a ruptured ectopic pregnancy and internal bleeding may include:  Sudden, severe pain in the abdomen and pelvis.  Dizziness, weakness, light-headedness, or fainting.  Pain in the shoulder or neck area. How is this diagnosed? This condition is diagnosed by:  A pelvic exam to locate pain or a mass in the abdomen.  A pregnancy test. This blood test checks for the presence as well as the specific level of  pregnancy hormone in the bloodstream.  Ultrasound. This is performed if a pregnancy test is positive. In this test, a probe is inserted into the vagina. The probe will detect a fetus, possibly in a location other than the uterus.  Taking a sample of uterus tissue (dilation and curettage, or D&C).  Surgery to perform a visual exam of the inside of the abdomen using a thin, lighted tube that has a tiny camera on the end (laparoscope).  Culdocentesis. This procedure involves inserting a needle at the top of the vagina, behind the uterus. If blood is present in this area, it may indicate that a fallopian tube is torn. How is this treated? This condition is treated with medicine or surgery. Medicine  An injection of a medicine (methotrexate) may be given to cause the pregnancy tissue to be absorbed. This medicine may save your fallopian tube. It may be given if: ? The diagnosis is made early, with no signs of active bleeding. ? The fallopian tube has not ruptured. ? You are considered to be a good candidate for the medicine. Usually, pregnancy hormone blood levels are checked after methotrexate treatment. This is to be sure that the medicine is effective. It may take 4-6 weeks for the pregnancy to be absorbed. Most pregnancies will be absorbed by 3 weeks.   Surgery  A laparoscope may be used to remove the pregnancy tissue.  If severe internal bleeding occurs, a larger cut (incision) may be made in the lower abdomen (laparotomy) to remove the fetus and placenta. This is done to stop the bleeding.  Part or all of the fallopian tube may be removed (salpingectomy) along with the fetus and placenta. The fallopian tube may also be repaired during the surgery.  In very rare circumstances, removal of the uterus (hysterectomy) may be required.  After surgery, pregnancy hormone testing may be done to be sure that there is no pregnancy tissue left. Whether your treatment is medicine or surgery, you may  receive a Rho (D) immune globulin shot to prevent problems with any future pregnancy. This shot may be given if:  You are Rh-negative and the baby's father is Rh-positive.  You are Rh-negative and you do not know the Rh type of the baby's father. Follow these instructions at home:  Rest and limit your activity after the procedure for as long as told by your health care provider.  Until your health care provider says that it is safe: ? Do not lift anything that is heavier than 10 lb (4.5 kg), or the limit that your health care provider tells you. ? Avoid physical exercise and any movement that requires effort (is strenuous).  To help prevent constipation: ? Eat a healthy diet that includes fruits, vegetables, and whole grains. ? Drink 6-8 glasses of water per day. Get help right away if:  You develop worsening pain that is not relieved by medicine.  You have: ? A fever or chills. ? Vaginal bleeding. ? Redness and swelling at the incision site. ? Nausea and vomiting.  You feel dizzy or weak.  You feel light-headed or you faint. This information is not intended to replace advice given to you by your health care provider. Make sure you discuss any questions you have with your health care provider. Document Revised: 06/03/2017 Document Reviewed: 01/21/2016 Elsevier Patient Education  2020 [employer] Inc.  

## 2019-08-20 NOTE — MAU Note (Addendum)
Pt has known ectopic on the right. Treated with MTX  2 weeks ago. BHCG has been decreasing. Pain has gotten a little worse in the past day . Notified MD and instructed to come in. Pain in in RLQ c/o some dizziness as well.

## 2019-08-20 NOTE — MAU Provider Note (Signed)
History     CSN: [id number]  Arrival date and time: 08/20/19 1652   First Provider Initiated Contact with Patient 08/20/19 1741      Chief Complaint  Patient presents with  . Pelvic Pain   [name] is a 29 y.o. [id number] at Unknown who presents to MAU for right-sided pelvic pain. Pt reports she called her OB about increasing discomfort in right-sided pelvis for the past couple of days, which was enough to keep her awake last night. Pt reports pain is around 4/10 and that the area is "constnatly very achy and tender and very localized" to the right-side of her pelvis. Pt reports she has been having vaginal bleeding for the past month and a half, some days it is a light period and other days it is as heavy as a period. Pt denies saturating two or more pads in an hour.  Pt has a known right adnexal ectopic pregnancy and was treated with MTX on Thursday 08/09/2019 and reports she has only had her hCG taken one time since she received MTX and her hCG was lower than the initial one taken (pt reports about 800, pt reports had blood drawn on 08/15/2019 at her OBs office) and reports she had a f/u US in the office that showed her mass had grown. Pt reports she has an appointment scheduled for Friday 08/24/2019 for repeat hCG and repeat US.  Pt denies vaginal discharge/odor/itching. Pt denies N/V, abdominal pain, constipation, diarrhea, or urinary problems. Pt denies fever, chills, fatigue, sweating or changes in appetite. Pt denies SOB or chest pain. Pt denies dizziness, HA, light-headedness, weakness.   OB History    Gravida  6   Para  2   Term  1   Preterm  1   AB  3   Living  2     SAB  3   TAB      Ectopic  0   Multiple  0   Live Births  2           Past Medical History:  Diagnosis Date  . Anemia   . Headache   . Hypothyroidism    has resolved  . Kidney calculi   . Kidney stones   . Traumatic injury during pregnancy in third trimester 06/19/2015     Past Surgical History:  Procedure Laterality Date  . MOUTH SURGERY      Family History  Problem Relation Age of Onset  . Hypertension Mother   . Kidney disease Father   . Cancer Maternal Grandmother   . Diabetes Maternal Grandmother   . Rheum arthritis Maternal Grandmother   . Kidney disease Maternal Grandfather   . Heart disease Maternal Grandfather   . Stroke Maternal Grandfather   . Rheum arthritis Maternal Grandfather   . Hypertension Paternal Grandmother   . Heart disease Paternal Grandmother   . Stroke Paternal Grandmother   . Rheum arthritis Paternal Grandmother   . Kidney disease Paternal Grandfather   . Heart disease Paternal Grandfather   . Hypertension Paternal Grandfather   . Rheum arthritis Paternal Grandfather   . Healthy Sister   . Healthy Brother   . Healthy Sister   . Healthy Sister   . Healthy Sister   . Healthy Brother   . Healthy Brother     Social History   Tobacco Use  . Smoking status: Former Smoker    Types: Pipe, Software engineer  . Smokeless tobacco: Never Used  Substance Use Topics  .  Alcohol use: Not Currently    Comment: Occasionally  . Drug use: No    Allergies:  Allergies  Allergen Reactions  . Lidocaine Itching, Nausea Only and Other (See Comments)    Dizziness, flushed     Medications Prior to Admission  Medication Sig Dispense Refill Last Dose  . meclizine (ANTIVERT) 25 MG tablet Take 1 tablet (25 mg total) by mouth 3 (three) times daily as needed for dizziness. 30 tablet 0 Past Week at Unknown time  . acetaminophen (TYLENOL) 325 MG tablet Take 2 tablets (650 mg total) by mouth every 4 (four) hours as needed (for pain scale < 4). (Patient not taking: Reported on 08/07/2019)       Review of Systems  Constitutional: Negative for chills, diaphoresis, fatigue and fever.  Eyes: Negative for visual disturbance.  Respiratory: Negative for shortness of breath.   Cardiovascular: Negative for chest pain.  Gastrointestinal: Negative for  abdominal pain, constipation, diarrhea, nausea and vomiting.  Genitourinary: Positive for pelvic pain and vaginal bleeding. Negative for dysuria, flank pain, frequency, urgency and vaginal discharge.  Neurological: Negative for dizziness, weakness, light-headedness and headaches.   Physical Exam   Blood pressure 115/61, pulse 93, temperature 98.1 F (36.7 C), resp. rate 18, last menstrual period 04/30/2019, unknown if currently breastfeeding.  Patient Vitals for the past 24 hrs:  BP Temp Pulse Resp  08/20/19 1721 115/61 98.1 F (36.7 C) 93 18   Physical Exam  Constitutional: She is oriented to person, place, and time. She appears well-developed and well-nourished. No distress.  HENT:  Head: Normocephalic and atraumatic.  Respiratory: Effort normal.  Neurological: She is alert and oriented to person, place, and time.  Skin: She is not diaphoretic.  Psychiatric: She has a normal mood and affect. Her behavior is normal. Judgment and thought content normal.   Results for orders placed or performed during the hospital encounter of 08/20/19 (from the past 24 hour(s))  Urinalysis, Routine w reflex microscopic     Status: Abnormal   Collection Time: 08/20/19  5:25 PM  Result Value Ref Range   Color, Urine YELLOW YELLOW   APPearance CLOUDY (A) CLEAR   Specific Gravity, Urine 1.019 1.005 - 1.030   pH 7.0 5.0 - 8.0   Glucose, UA NEGATIVE NEGATIVE mg/dL   Hgb urine dipstick NEGATIVE NEGATIVE   Bilirubin Urine NEGATIVE NEGATIVE   Ketones, ur NEGATIVE NEGATIVE mg/dL   Protein, ur NEGATIVE NEGATIVE mg/dL   Nitrite NEGATIVE NEGATIVE   Leukocytes,Ua NEGATIVE NEGATIVE  CBC     Status: Abnormal   Collection Time: 08/20/19  6:02 PM  Result Value Ref Range   WBC 6.8 4.0 - 10.5 K/uL   RBC 3.74 (L) 3.87 - 5.11 MIL/uL   Hemoglobin 11.9 (L) 12.0 - 15.0 g/dL   HCT 09.8 (L) 11.9 - 14.7 %   MCV 92.0 80.0 - 100.0 fL   MCH 31.8 26.0 - 34.0 pg   MCHC 34.6 30.0 - 36.0 g/dL   RDW 82.9 56.2 - 13.0 %    Platelets 263 150 - 400 K/uL   nRBC 0.0 0.0 - 0.2 %  Comprehensive metabolic panel     Status: Abnormal   Collection Time: 08/20/19  6:02 PM  Result Value Ref Range   Sodium 139 135 - 145 mmol/L   Potassium 4.0 3.5 - 5.1 mmol/L   Chloride 103 98 - 111 mmol/L   CO2 26 22 - 32 mmol/L   Glucose, Bld 111 (H) 70 - 99 mg/dL  BUN 10 6 - 20 mg/dL   Creatinine, Ser 0.74 0.44 - 1.00 mg/dL   Calcium 9.2 8.9 - 10.3 mg/dL   Total Protein 6.6 6.5 - 8.1 g/dL   Albumin 4.2 3.5 - 5.0 g/dL   AST 18 15 - 41 U/L   ALT 12 0 - 44 U/L   Alkaline Phosphatase 58 38 - 126 U/L   Total Bilirubin 0.6 0.3 - 1.2 mg/dL   GFR calc non Af Amer >60 >60 mL/min   GFR calc Af Amer >60 >60 mL/min   Anion gap 10 5 - 15  hCG, quantitative, pregnancy     Status: Abnormal   Collection Time: 08/20/19  6:02 PM  Result Value Ref Range   hCG, Beta Chain, Quant, S 336 (H) <5 mIU/mL   US OB Comp Less 14 Wks  Result Date: 08/12/2019 CLINICAL DATA:  Known ectopic, near syncope, methotrexate administered 3 days prior, beta HCG 1232 EXAM: OBSTETRIC <14 WK [name] AND TRANSVAGINAL OB US TECHNIQUE: Both transabdominal and transvaginal ultrasound examinations were performed for complete evaluation of the gestation as well as the maternal uterus, adnexal regions, and pelvic cul-de-sac. Transvaginal technique was performed to assess early pregnancy. COMPARISON:  Prior gestational ultrasound 03/18/2017, reported ultrasound from this gestation is available at the time of interpretation FINDINGS: Intrauterine gestational sac: None Yolk sac:  Not Visualized. Embryo:  Not Visualized. Cardiac Activity: Not Visualized. Maternal uterus/adnexae: Retroflexed uterus. No maternal uterine abnormality is seen. Left ovary is unremarkable. Heterogeneously echogenic structure in the right ovary measuring 1.8 x 1.2 x 1.5 cm appears separate from the right ovarian parenchyma and is compatible with reported history of ectopic pregnancy. Small amount of minimally  complex fluid is seen in the right adnexa. IMPRESSION: 1.8 cm heterogeneous structure in the right adnexa compatible reported history of ectopic pregnancy. Trace minimally complex free fluid may reflect a small amount of hemorrhage. Prior ultrasounds of this gestation are not available at the time of interpretation. Consider short-term follow-up ultrasound and trending quantitative beta hCG. These results were called by telephone at the time of interpretation on 08/12/2019 at 11:22 pm to provider [facility] , who verbally acknowledged these results. Electronically Signed   By: Lovena Le M.D.   On: 08/12/2019 23:22   US OB Transvaginal  Result Date: 08/20/2019 CLINICAL DATA:  Right ectopic pregnancy treated with methotrexate increasing pain EXAM: TRANSVAGINAL OB ULTRASOUND TECHNIQUE: Transvaginal ultrasound was performed for complete evaluation of the gestation as well as the maternal uterus, adnexal regions, and pelvic cul-de-sac. COMPARISON:  08/12/2019 FINDINGS: Intrauterine gestational sac: None Yolk sac:  Not Visualized. Embryo:  Not Visualized. Maternal uterus/adnexae: Ovaries are within normal limits. Right ovary measures 2.4 x 2.8 x 2.8 cm. The left ovary measures 2.9 x 1.8 x 2.4 cm. Heterogenous Iso to slightly echogenic right adnexal mass measuring 2 x 1.9 by 2 cm, previously 1.8 x 1.2 x 1.5 cm. Trace free fluid. IMPRESSION: 1. No IUP identified 2. Persistent heterogeneous slightly echogenic mass in the right adnexa separate from the ovary presumably corresponding to history of known ectopic pregnancy. Mass measures slightly larger, up to 2 cm, slight increase in size of ectopic mass can be seen in the setting of healing but should be correlated with the HCG to ensure appropriate response to therapy. 3. Trace free fluid Electronically Signed   By: Donavan Foil M.D.   On: 08/20/2019 19:26   US OB Transvaginal  Result Date: 08/12/2019 CLINICAL DATA:  Known ectopic, near syncope, methotrexate  administered 3 days prior, beta HCG 1232 EXAM: OBSTETRIC <14 WK [name] AND TRANSVAGINAL OB US TECHNIQUE: Both transabdominal and transvaginal ultrasound examinations were performed for complete evaluation of the gestation as well as the maternal uterus, adnexal regions, and pelvic cul-de-sac. Transvaginal technique was performed to assess early pregnancy. COMPARISON:  Prior gestational ultrasound 03/18/2017, reported ultrasound from this gestation is available at the time of interpretation FINDINGS: Intrauterine gestational sac: None Yolk sac:  Not Visualized. Embryo:  Not Visualized. Cardiac Activity: Not Visualized. Maternal uterus/adnexae: Retroflexed uterus. No maternal uterine abnormality is seen. Left ovary is unremarkable. Heterogeneously echogenic structure in the right ovary measuring 1.8 x 1.2 x 1.5 cm appears separate from the right ovarian parenchyma and is compatible with reported history of ectopic pregnancy. Small amount of minimally complex fluid is seen in the right adnexa. IMPRESSION: 1.8 cm heterogeneous structure in the right adnexa compatible reported history of ectopic pregnancy. Trace minimally complex free fluid may reflect a small amount of hemorrhage. Prior ultrasounds of this gestation are not available at the time of interpretation. Consider short-term follow-up ultrasound and trending quantitative beta hCG. These results were called by telephone at the time of interpretation on 08/12/2019 at 11:22 pm to provider [facility] , who verbally acknowledged these results. Electronically Signed   By: Kreg Shropshire M.D.   On: 08/12/2019 23:22   MAU Course  Procedures  MDM -increasing right-sided pelvic pain in setting of known right ectopic pregnancy and treatment with MTX on 08/09/2019 -UA: cloudy, otherwise WNL -CBC: WNL, stable since 08/12/2019 -CMP: WNL -hCG: 336 -[name]: persistent heterogeneous slightly echogenic mass in the right adnexa separate from the ovary presumably  corresponding to history of known ectopic pregnancy. Mass measures slightly larger, up to 2 cm, slight increase in size of ectopic mass can be seen in the setting of healing but should be correlated with the HCG to ensure appropriate response to therapy. Trace free fluid. -consulted with Dr. Debroah Loop @731PM , pt does not need repeat dose of MTX at this time due to drop in hCG levels. -pt discharged to home in stable condition  Orders Placed This Encounter  Procedures  . OB Transvaginal    Standing Status:   Standing    Number of Occurrences:   1    Order Specific Question:   Symptom/Reason for Exam    Answer:   Ectopic pregnancy of right ovary US  . Urinalysis, Routine w reflex microscopic    Standing Status:   Standing    Number of Occurrences:   1  . CBC    Standing Status:   Standing    Number of Occurrences:   1  . Comprehensive metabolic panel    Standing Status:   Standing    Number of Occurrences:   1  . hCG, quantitative, pregnancy    Standing Status:   Standing    Number of Occurrences:   1  . Discharge patient    Order Specific Question:   Discharge disposition    Answer:   01-Home or Self Care [1]    Order Specific Question:   Discharge patient date    Answer:   08/20/2019   No orders of the defined types were placed in this encounter.  Assessment and Plan   1. Ectopic pregnancy of right ovary   2. Pelvic pain   3. Vaginal bleeding   4. Blood type, Rh positive    Allergies as of 08/20/2019      Reactions  Lidocaine Itching, Nausea Only, Other (See Comments)   Dizziness, flushed      Medication List    TAKE these medications   acetaminophen 325 MG tablet Commonly known as: Tylenol Take 2 tablets (650 mg total) by mouth every 4 (four) hours as needed (for pain scale < 4).   meclizine 25 MG tablet Commonly known as: ANTIVERT Take 1 tablet (25 mg total) by mouth 3 (three) times daily as needed for dizziness.      -pt to keep OB appt on Friday  for f/u -strict bleeding/pain/return MAU precautions given -pt discharged to home in stable condition  Joni Reining E Mallie Linnemann 08/20/2019, 7:59 PM

## 2019-08-22 DIAGNOSIS — O00109 Unspecified tubal pregnancy without intrauterine pregnancy: Secondary | ICD-10-CM | POA: Diagnosis not present

## 2019-08-24 DIAGNOSIS — O00109 Unspecified tubal pregnancy without intrauterine pregnancy: Secondary | ICD-10-CM | POA: Diagnosis not present

## 2019-08-29 DIAGNOSIS — O00101 Right tubal pregnancy without intrauterine pregnancy: Secondary | ICD-10-CM | POA: Diagnosis not present

## 2019-08-29 DIAGNOSIS — Z3A01 Less than 8 weeks gestation of pregnancy: Secondary | ICD-10-CM | POA: Diagnosis not present

## 2019-09-04 ENCOUNTER — Inpatient Hospital Stay (HOSPITAL_COMMUNITY): Payer: BC Managed Care – PPO

## 2019-09-04 ENCOUNTER — Inpatient Hospital Stay (HOSPITAL_COMMUNITY)
Admission: AD | Admit: 2019-09-04 | Discharge: 2019-09-04 | Disposition: A | Discharge status: Home / Self Care | Payer: BC Managed Care – PPO | Attending: Obstetrics | Admitting: Obstetrics

## 2019-09-04 ENCOUNTER — Encounter (HOSPITAL_COMMUNITY): Payer: Self-pay | Admitting: Obstetrics

## 2019-09-04 ENCOUNTER — Other Ambulatory Visit: Payer: Self-pay

## 2019-09-04 DIAGNOSIS — O00109 Unspecified tubal pregnancy without intrauterine pregnancy: Secondary | ICD-10-CM | POA: Diagnosis not present

## 2019-09-04 DIAGNOSIS — O009 Unspecified ectopic pregnancy without intrauterine pregnancy: Secondary | ICD-10-CM | POA: Insufficient documentation

## 2019-09-04 DIAGNOSIS — Z87891 Personal history of nicotine dependence: Secondary | ICD-10-CM | POA: Diagnosis not present

## 2019-09-04 DIAGNOSIS — N83201 Unspecified ovarian cyst, right side: Secondary | ICD-10-CM | POA: Insufficient documentation

## 2019-09-04 DIAGNOSIS — Z3A00 Weeks of gestation of pregnancy not specified: Secondary | ICD-10-CM | POA: Diagnosis not present

## 2019-09-04 DIAGNOSIS — N939 Abnormal uterine and vaginal bleeding, unspecified: Secondary | ICD-10-CM

## 2019-09-04 LAB — COMPREHENSIVE METABOLIC PANEL
ALT: 11 U/L (ref 0–44)
AST: 16 U/L (ref 15–41)
Albumin: 4.2 g/dL (ref 3.5–5.0)
Alkaline Phosphatase: 58 U/L (ref 38–126)
Anion gap: 9 (ref 5–15)
BUN: 12 mg/dL (ref 6–20)
CO2: 25 mmol/L (ref 22–32)
Calcium: 9.3 mg/dL (ref 8.9–10.3)
Chloride: 104 mmol/L (ref 98–111)
Creatinine, Ser: 0.74 mg/dL (ref 0.44–1.00)
GFR calc Af Amer: >60 mL/min (ref >60)
GFR calc non Af Amer: >60 mL/min (ref >60)
Glucose, Bld: 116 mg/dL — ABNORMAL HIGH (ref 70–99)
Potassium: 3.9 mmol/L (ref 3.5–5.1)
Sodium: 138 mmol/L (ref 135–145)
Total Bilirubin: 0.8 mg/dL (ref 0.3–1.2)
Total Protein: 6.6 g/dL (ref 6.5–8.1)

## 2019-09-04 LAB — CBC
HCT: 36 % (ref 36.0–46.0)
Hemoglobin: 12 g/dL (ref 12.0–15.0)
MCH: 31.7 pg (ref 26.0–34.0)
MCHC: 33.3 g/dL (ref 30.0–36.0)
MCV: 95 fL (ref 80.0–100.0)
Platelets: 253 10*3/uL (ref 150–400)
RBC: 3.79 MIL/uL — ABNORMAL LOW (ref 3.87–5.11)
RDW: 12.8 % (ref 11.5–15.5)
WBC: 5.2 10*3/uL (ref 4.0–10.5)
nRBC: 0 % (ref 0.0–0.2)

## 2019-09-04 LAB — HCG, QUANTITATIVE, PREGNANCY: hCG, Beta Chain, Quant, S: 3 m[IU]/mL (ref <5)

## 2019-09-04 NOTE — MAU Note (Signed)
.   Alison Turner is a 29 y.o. at Unknown here in MAU reporting: Patient was diagnosed with an ectopic on 2/4 and received methotrexate. She stated that her hcg levels were 17 last week but there is no evidence of it shrinking at this point. She has vaginal bleeding that increased this morning that is a little heavier than a period but she is not saturating a pad in an hour. She states that her dizziness began when her bleeding began to pick up. No recent intercourse. Patient states her pain is like a period cramp.   Pain score: 1  Vitals:   09/04/19 1819  BP: 117/68  Pulse: 78  Resp: 16  Temp: 98.2 F (36.8 C)  SpO2: 100%

## 2019-09-04 NOTE — MAU Provider Note (Signed)
History     [id number]  Arrival date and time: 09/04/19 1801    Chief Complaint  Patient presents with  . Ectopic Pregnancy  . Vaginal Bleeding     HPI Alison Turner is a 29 y.o. who was recently seen and treated for ectopic pregnancy, who presents for worsening bleeding.   Treated for known R adnexal ectopic on 08/09/2019 Seen for R sided pelvic pain on 08/20/2019, US showed no significant change in size of mass from prior and hcg levels dropping, d/c home  Reports last HCG check was a week ago and was down to 17 Last [name] was almost two weeks ago, has another scheduled in two days Was told by doctor her bleeding would stop as HCG went down Today has had multiple trickles of blood Has also been feeling dizzy since this afternoon Had a full meal and a lot of water so was concerned she was feeling that way Called OB and couldn't get worked into schedule so directed to MAU for evaluation Has also been having some abdominal pain, similar to a cramp Has not taken any medications Denies burning or pain with urination, vaginal discharge Endorses some mild low back pain      OB History    Gravida  6   Para  2   Term  1   Preterm  1   AB  3   Living  2     SAB  3   TAB      Ectopic  0   Multiple  0   Live Births  2           Past Medical History:  Diagnosis Date  . Anemia   . Headache   . Hypothyroidism    has resolved  . Kidney calculi   . Kidney stones   . Traumatic injury during pregnancy in third trimester 06/19/2015    Past Surgical History:  Procedure Laterality Date  . MOUTH SURGERY      Family History  Problem Relation Age of Onset  . Hypertension Mother   . Kidney disease Father   . Cancer Maternal Grandmother   . Diabetes Maternal Grandmother   . Rheum arthritis Maternal Grandmother   . Kidney disease Maternal Grandfather   . Heart disease Maternal Grandfather   . Stroke Maternal Grandfather   . Rheum arthritis Maternal  Grandfather   . Hypertension Paternal Grandmother   . Heart disease Paternal Grandmother   . Stroke Paternal Grandmother   . Rheum arthritis Paternal Grandmother   . Kidney disease Paternal Grandfather   . Heart disease Paternal Grandfather   . Hypertension Paternal Grandfather   . Rheum arthritis Paternal Grandfather   . Healthy Sister   . Healthy Brother   . Healthy Sister   . Healthy Sister   . Healthy Sister   . Healthy Brother   . Healthy Brother     Social History   Socioeconomic History  . Marital status: Married    Spouse name: Fayrene Fearing  . Number of children: 1  . Years of education: Some college  . Highest education level: Not on file  Occupational History  . Occupation: Stay at home mom  Tobacco Use  . Smoking status: Former Smoker    Types: Pipe, Software engineer  . Smokeless tobacco: Never Used  Substance and Sexual Activity  . Alcohol use: Not Currently    Comment: Occasionally  . Drug use: Not Currently  . Sexual activity: Yes  Partners: Male    Comment: Married  Other Topics Concern  . Not on file  Social History Narrative   Lives at home w/ her husband and son   Left-hand   Caffeine: 1 cup every other day   Social Determinants of Health   Financial Resource Strain:   . Difficulty of Paying Living Expenses: Not on file  Food Insecurity:   . Worried About Charity fundraiser in the Last Year: Not on file  . Ran Out of Food in the Last Year: Not on file  Transportation Needs:   . Lack of Transportation (Medical): Not on file  . Lack of Transportation (Non-Medical): Not on file  Physical Activity:   . Days of Exercise per Week: Not on file  . Minutes of Exercise per Session: Not on file  Stress:   . Feeling of Stress : Not on file  Social Connections:   . Frequency of Communication with Friends and Family: Not on file  . Frequency of Social Gatherings with Friends and Family: Not on file  . Attends Religious Services: Not on file  . Active Member of  Clubs or Organizations: Not on file  . Attends Archivist Meetings: Not on file  . Marital Status: Not on file  Intimate Partner Violence:   . Fear of Current or Ex-Partner: Not on file  . Emotionally Abused: Not on file  . Physically Abused: Not on file  . Sexually Abused: Not on file    Allergies  Allergen Reactions  . Lidocaine Itching, Nausea Only and Other (See Comments)    Dizziness, flushed     No current facility-administered medications on file prior to encounter.   Current Outpatient Medications on File Prior to Encounter  Medication Sig Dispense Refill  . meclizine (ANTIVERT) 25 MG tablet Take 1 tablet (25 mg total) by mouth 3 (three) times daily as needed for dizziness. 30 tablet 0  . acetaminophen (TYLENOL) 325 MG tablet Take 2 tablets (650 mg total) by mouth every 4 (four) hours as needed (for pain scale < 4). (Patient not taking: Reported on 08/07/2019)       ROS Pertinent positives and negative per HPI, all others reviewed and negative  Physical Exam   BP 104/65   Pulse 75   Temp 98.2 F (36.8 C) (Oral)   Resp 16   Ht 5\' 3"  (1.6 m)   Wt 51 kg   LMP 04/30/2019 (LMP Unknown)   SpO2 100%   BMI 19.91 kg/m   Physical Exam  Vitals reviewed. Constitutional: She appears well-developed and well-nourished. No distress.  Eyes: No scleral icterus.  Respiratory: Effort normal. No respiratory distress.  GI: Soft. She exhibits no distension. There is no abdominal tenderness. There is no rebound and no guarding.  Musculoskeletal:        General: No edema.  Neurological: She is alert. Coordination normal.  Skin: Skin is warm and dry. She is not diaphoretic.  Psychiatric: She has a normal mood and affect.    Cervical Exam    Bedside Ultrasound Not done  FHT N/a  Labs Results for orders placed or performed during the hospital encounter of 09/04/19 (from the past 24 hour(s))  CBC     Status: Abnormal   Collection Time: 09/04/19  6:53 PM  Result  Value Ref Range   WBC 5.2 4.0 - 10.5 K/uL   RBC 3.79 (L) 3.87 - 5.11 MIL/uL   Hemoglobin 12.0 12.0 - 15.0 g/dL   HCT  36.0 36.0 - 46.0 %   MCV 95.0 80.0 - 100.0 fL   MCH 31.7 26.0 - 34.0 pg   MCHC 33.3 30.0 - 36.0 g/dL   RDW 23.7 62.8 - 31.5 %   Platelets 253 150 - 400 K/uL   nRBC 0.0 0.0 - 0.2 %  hCG, quantitative, pregnancy     Status: None   Collection Time: 09/04/19  6:53 PM  Result Value Ref Range   hCG, Beta Chain, Quant, S 3 <5 mIU/mL  Comprehensive metabolic panel     Status: Abnormal   Collection Time: 09/04/19  6:53 PM  Result Value Ref Range   Sodium 138 135 - 145 mmol/L   Potassium 3.9 3.5 - 5.1 mmol/L   Chloride 104 98 - 111 mmol/L   CO2 25 22 - 32 mmol/L   Glucose, Bld 116 (H) 70 - 99 mg/dL   BUN 12 6 - 20 mg/dL   Creatinine, Ser 1.76 0.44 - 1.00 mg/dL   Calcium 9.3 8.9 - 16.0 mg/dL   Total Protein 6.6 6.5 - 8.1 g/dL   Albumin 4.2 3.5 - 5.0 g/dL   AST 16 15 - 41 U/L   ALT 11 0 - 44 U/L   Alkaline Phosphatase 58 38 - 126 U/L   Total Bilirubin 0.8 0.3 - 1.2 mg/dL   GFR calc non Af Amer >60 >60 mL/min   GFR calc Af Amer >60 >60 mL/min   Anion gap 9 5 - 15    Imaging US OB Transvaginal  Result Date: 09/04/2019 CLINICAL DATA:  29 year old female with known right adnexal ectopic pregnancy status post treatment with 1 round of methotrexate. Follow-up ultrasound. EXAM: TRANSVAGINAL OB ULTRASOUND TECHNIQUE: Transvaginal ultrasound was performed for complete evaluation of the gestation as well as the maternal uterus, adnexal regions, and pelvic cul-de-sac. COMPARISON:  Ultrasound dated 08/20/2019. FINDINGS: The uterus is retroflexed and appears unremarkable. The endometrium measures approximately 2 mm in thickness. No intrauterine pregnancy identified. There is a 4.3 x 3.4 x 4.0 cm cyst in the right ovary. There is a complex mass measuring 2.1 x 2.5 x 1.8 cm (previously 2.0 x 1.9 x 2.0 cm) in the region of the right adnexa, separate from the right ovary corresponding to the  known ectopic pregnancy. No internal vascularity identified within this mass. Overall there has been interval decreased echogenicity and increased cystic spaces within this mass. The left ovary is unremarkable and measures 1.9 x 3.0 x 1.5 cm. No significant free fluid in the pelvis. IMPRESSION: Persistent right adnexal mass corresponding to the known ectopic pregnancy. Correlation with HCG levels and continued follow-up recommended. Electronically Signed   By: Elgie Collard M.D.   On: 09/04/2019 20:05    MAU Course  Procedures  Lab Orders     CBC     hCG, quantitative, pregnancy     Comprehensive metabolic panel No orders of the defined types were placed in this encounter.  ORTHOSTATIC VITAL SIGNS US OB TRANSVAGINAL DISCHARGE PATIENT   MDM Moderate  Assessment and Plan  #Vaginal bleeding Patient presents w increase in VB and abdominal pain in setting of recent known ectopic s/p treatment. Labs today show negative hcg (3) and unremarkable CBC. TVUS shows no significant change in size of ectopic though does have decreased echogenicity and increased cystic spaces. Also w new ~4cm R ovarian cyst. Etiology of bleeding unclear, possible due to menstrual cycle. Case discussed w Dr. Algie Coffer by Wynelle Bourgeois CNM, low c/f new cyst being related to ectopic  process, given no fluid in pelvis and normal CBC can continue to follow as outpatient.   D/c to home in stable condition  Venora Maples

## 2019-09-12 DIAGNOSIS — O00101 Right tubal pregnancy without intrauterine pregnancy: Secondary | ICD-10-CM | POA: Diagnosis not present

## 2019-09-18 ENCOUNTER — Encounter (HOSPITAL_COMMUNITY): Payer: Self-pay | Admitting: Emergency Medicine

## 2019-09-18 ENCOUNTER — Emergency Department (HOSPITAL_COMMUNITY): Payer: BC Managed Care – PPO

## 2019-09-18 ENCOUNTER — Emergency Department (HOSPITAL_COMMUNITY)
Admission: EM | Admit: 2019-09-18 | Discharge: 2019-09-19 | Disposition: A | Discharge status: Other Acute Inpatient Hospital | Payer: BC Managed Care – PPO | Attending: Emergency Medicine | Admitting: Emergency Medicine

## 2019-09-18 ENCOUNTER — Other Ambulatory Visit: Payer: Self-pay

## 2019-09-18 DIAGNOSIS — Z20822 Contact with and (suspected) exposure to covid-19: Secondary | ICD-10-CM | POA: Diagnosis not present

## 2019-09-18 DIAGNOSIS — Y999 Unspecified external cause status: Secondary | ICD-10-CM | POA: Diagnosis not present

## 2019-09-18 DIAGNOSIS — Z79899 Other long term (current) drug therapy: Secondary | ICD-10-CM | POA: Diagnosis not present

## 2019-09-18 DIAGNOSIS — X58XXXA Exposure to other specified factors, initial encounter: Secondary | ICD-10-CM | POA: Diagnosis not present

## 2019-09-18 DIAGNOSIS — T18198A Other foreign object in esophagus causing other injury, initial encounter: Secondary | ICD-10-CM | POA: Diagnosis not present

## 2019-09-18 DIAGNOSIS — Y93G2 Activity, grilling and smoking food: Secondary | ICD-10-CM | POA: Insufficient documentation

## 2019-09-18 DIAGNOSIS — T189XXA Foreign body of alimentary tract, part unspecified, initial encounter: Secondary | ICD-10-CM | POA: Diagnosis not present

## 2019-09-18 DIAGNOSIS — Z03818 Encounter for observation for suspected exposure to other biological agents ruled out: Secondary | ICD-10-CM | POA: Diagnosis not present

## 2019-09-18 DIAGNOSIS — S20359A Superficial foreign body of unspecified front wall of thorax, initial encounter: Secondary | ICD-10-CM | POA: Diagnosis not present

## 2019-09-18 DIAGNOSIS — Y92008 Other place in unspecified non-institutional (private) residence as the place of occurrence of the external cause: Secondary | ICD-10-CM | POA: Diagnosis not present

## 2019-09-18 DIAGNOSIS — T18108A Unspecified foreign body in esophagus causing other injury, initial encounter: Secondary | ICD-10-CM | POA: Diagnosis not present

## 2019-09-18 NOTE — ED Triage Notes (Signed)
Patient complaining of swallowing a piece of metal brush that was in her hamburger. Patient states she is having discomfort in her throat.

## 2019-09-18 NOTE — ED Provider Notes (Addendum)
[facility] DEPT Provider Note   CSN: [id number] Arrival date & time: 09/18/19  2112     History Chief Complaint  Patient presents with  . Swallowed Foreign Body    a piece of metal   Alison Turner is a 29 y.o. female.  Alison Turner is a 29 year old female with past medical history significant for recent ectopic pregnancy s/p D&C who presents today after swallowing a foreign body.  Patient states that she was grilling burgers with her husband today when she accidentally swallowed a metal bristle from the grill brush.  She states that she was initially able to pull out 1 resolved, but has a sensation in her throat that something is stuck.  She discussed this with her PCP, who ultimately decided to direct her to the ED.  Currently, the patient denies any pain, but does have discomfort with swallowing.  She denies any excessive salivation, SOB, chest pain, or abdominal discomfort.       Past Medical History:  Diagnosis Date  . Anemia   . Headache   . Hypothyroidism    has resolved  . Kidney calculi   . Kidney stones   . Traumatic injury during pregnancy in third trimester 06/19/2015   Patient Active Problem List   Diagnosis Date Noted  . Maternal anemia, with delivery 01/14/2018  . SVD 7/12 01/13/2018  . Postpartum care following vaginal delivery (7/12) 07/17/2015  . Traumatic injury during pregnancy in third trimester 06/19/2015   Past Surgical History:  Procedure Laterality Date  . MOUTH SURGERY      OB History    Gravida  6   Para  2   Term  1   Preterm  1   AB  3   Living  2     SAB  3   TAB      Ectopic  0   Multiple  0   Live Births  2          Family History  Problem Relation Age of Onset  . Hypertension Mother   . Kidney disease Father   . Cancer Maternal Grandmother   . Diabetes Maternal Grandmother   . Rheum arthritis Maternal Grandmother   . Kidney disease Maternal Grandfather   . Heart disease  Maternal Grandfather   . Stroke Maternal Grandfather   . Rheum arthritis Maternal Grandfather   . Hypertension Paternal Grandmother   . Heart disease Paternal Grandmother   . Stroke Paternal Grandmother   . Rheum arthritis Paternal Grandmother   . Kidney disease Paternal Grandfather   . Heart disease Paternal Grandfather   . Hypertension Paternal Grandfather   . Rheum arthritis Paternal Grandfather   . Healthy Sister   . Healthy Brother   . Healthy Sister   . Healthy Sister   . Healthy Sister   . Healthy Brother   . Healthy Brother    Social History   Tobacco Use  . Smoking status: Former Smoker    Types: Pipe, Landscape architect  . Smokeless tobacco: Never Used  Substance Use Topics  . Alcohol use: Not Currently    Comment: Occasionally  . Drug use: Not Currently   Home Medications Prior to Admission medications   Medication Sig Start Date End Date Taking? Authorizing Provider  acetaminophen (TYLENOL) 325 MG tablet Take 2 tablets (650 mg total) by mouth every 4 (four) hours as needed (for pain scale < 4). Patient not taking: Reported on 08/07/2019 01/15/18   Derrell Lolling  C, CNM  meclizine (ANTIVERT) 25 MG tablet Take 1 tablet (25 mg total) by mouth 3 (three) times daily as needed for dizziness. 08/13/19   Donette Larry, CNM   Allergies    Lidocaine  Review of Systems   Review of Systems  All other systems reviewed and are negative.   Physical Exam Updated Vital Signs BP 109/74 (BP Location: Right Arm)   Pulse 88   Temp 98.5 F (36.9 C) (Oral)   Resp 14   Ht 5\' 3"  (1.6 m)   Wt 48.1 kg   LMP 04/30/2019 (LMP Unknown)   SpO2 100%   Breastfeeding Unknown Comment: patient is recovering from etopic pregnancy  BMI 18.78 kg/m   Physical Exam Vitals reviewed.  Constitutional:      Appearance: Normal appearance.  HENT:     Head: Normocephalic and atraumatic.     Nose: Nose normal.     Mouth/Throat:     Mouth: Mucous membranes are moist.     Pharynx: Oropharynx is clear.    Eyes:     Conjunctiva/sclera: Conjunctivae normal.  Cardiovascular:     Rate and Rhythm: Normal rate and regular rhythm.     Pulses: Normal pulses.     Heart sounds: Normal heart sounds. No murmur. No friction rub. No gallop.   Pulmonary:     Effort: Pulmonary effort is normal. No respiratory distress.     Breath sounds: Normal breath sounds. No wheezing or rales.  Abdominal:     General: Abdomen is flat. Bowel sounds are normal. There is no distension.     Palpations: Abdomen is soft.     Tenderness: There is no abdominal tenderness. There is no guarding.  Skin:    General: Skin is warm.     Comments: Acne present on the folds next to nose  Neurological:     General: No focal deficit present.     Mental Status: She is alert and oriented to person, place, and time.  Psychiatric:        Mood and Affect: Mood normal.    ED Results / Procedures / Treatments   Labs (all labs ordered are listed, but only abnormal results are displayed) Labs Reviewed - No data to display  EKG None  Radiology DG Neck Soft Tissue  Result Date: 09/18/2019 CLINICAL DATA:  Swallowed metallic foreign body. EXAM: NECK SOFT TISSUES - 1+ VIEW COMPARISON:  None. FINDINGS: There is a linear density within the prevertebral soft tissues at the level of the thyroid cartilage. This is seen only on the lateral projection. IMPRESSION: Potential radiopaque foreign body within the prevertebral soft tissues at the level of the thyroid cartilage, seen only on the lateral projection. This could be within the upper esophagus. CT of the neck recommended. Electronically Signed   By: 09/20/2019 M.D.   On: 09/18/2019 22:31   DG Chest 1 View  Result Date: 09/18/2019 CLINICAL DATA:  Foreign body. EXAM: CHEST  1 VIEW COMPARISON:  None. FINDINGS: The heart size and mediastinal contours are within normal limits. Both lungs are clear. The visualized skeletal structures are unremarkable. IMPRESSION: No active disease.  Electronically Signed   By: 09/20/2019 M.D.   On: 09/18/2019 22:29    Procedures Procedures (including critical care time)  Medications Ordered in ED Medications - No data to display  ED Course  I have reviewed the triage vital signs and the nursing notes.  Pertinent labs & imaging results that were available during my care of  the patient were reviewed by me and considered in my medical decision making (see chart for details).    MDM Rules/Calculators/A&P                      Concerned that the patient swallowed metal bristles from grill brush.  Will obtain CXR and x-ray of the neck soft tissue.  X-ray soft tissue neck demonstrated foreign body that appeared to be in esophagus.  GI is aware and they will come see the patient.  Final Clinical Impression(s) / ED Diagnoses Final diagnoses:  None    Rx / DC Orders ED Discharge Orders    None       Kirt Boys, MD 09/18/19 2300    Kirt Boys, MD 09/18/19 2315    Jacalyn Lefevre, MD 09/19/19 1601

## 2019-09-19 ENCOUNTER — Encounter (HOSPITAL_COMMUNITY)
Admission: EM | Disposition: A | Discharge status: Other Acute Inpatient Hospital | Payer: Self-pay | Source: Home / Self Care | Attending: Emergency Medicine

## 2019-09-19 ENCOUNTER — Emergency Department (HOSPITAL_COMMUNITY): Payer: BC Managed Care – PPO | Admitting: Anesthesiology

## 2019-09-19 DIAGNOSIS — X58XXXA Exposure to other specified factors, initial encounter: Secondary | ICD-10-CM | POA: Diagnosis not present

## 2019-09-19 DIAGNOSIS — T189XXA Foreign body of alimentary tract, part unspecified, initial encounter: Secondary | ICD-10-CM | POA: Diagnosis not present

## 2019-09-19 DIAGNOSIS — M542 Cervicalgia: Secondary | ICD-10-CM | POA: Diagnosis not present

## 2019-09-19 DIAGNOSIS — T18108A Unspecified foreign body in esophagus causing other injury, initial encounter: Secondary | ICD-10-CM | POA: Diagnosis not present

## 2019-09-19 DIAGNOSIS — T17200A Unspecified foreign body in pharynx causing asphyxiation, initial encounter: Secondary | ICD-10-CM | POA: Diagnosis not present

## 2019-09-19 DIAGNOSIS — Y999 Unspecified external cause status: Secondary | ICD-10-CM | POA: Diagnosis not present

## 2019-09-19 DIAGNOSIS — T18198A Other foreign object in esophagus causing other injury, initial encounter: Secondary | ICD-10-CM | POA: Diagnosis not present

## 2019-09-19 LAB — RESPIRATORY PANEL BY RT PCR (FLU A&B, COVID)
Influenza A by PCR: NEGATIVE
Influenza B by PCR: NEGATIVE
SARS Coronavirus 2 by RT PCR: NEGATIVE

## 2019-09-19 SURGERY — ESOPHAGOGASTRODUODENOSCOPY (EGD) WITH PROPOFOL
Anesthesia: Monitor Anesthesia Care

## 2019-09-19 SURGERY — CANCELLED PROCEDURE
Anesthesia: General

## 2019-09-19 MED ORDER — TETANUS-DIPHTH-ACELL PERTUSSIS 5-2.5-18.5 LF-MCG/0.5 IM SUSP
0.5000 mL | Freq: Once | INTRAMUSCULAR | Status: DC
  Filled 2019-09-19: qty 0.5

## 2019-09-19 MED ORDER — CLINDAMYCIN PHOSPHATE 600 MG/50ML IV SOLN
600.0000 mg | Freq: Once | INTRAVENOUS | Status: AC
  Administered 2019-09-19: 600 mg via INTRAVENOUS
  Filled 2019-09-19: qty 50

## 2019-09-19 MED ORDER — SODIUM CHLORIDE 0.9 % IV SOLN: INTRAVENOUS | Status: DC

## 2019-09-19 SURGICAL SUPPLY — 14 items

## 2019-09-19 NOTE — ED Notes (Signed)
Pt refused vaccine due to having reactions to multicompoent vaccines in the past. Pt states she will wait and get it at PCP.

## 2019-09-19 NOTE — Anesthesia Preprocedure Evaluation (Signed)
Anesthesia Evaluation  Patient identified by MRN, date of birth, ID band Patient awake    Reviewed: Allergy & Precautions, H&P , NPO status , Patient's Chart, lab work & pertinent test results  Airway        Dental no notable dental hx.    Pulmonary neg pulmonary ROS, former smoker,    Pulmonary exam normal        Cardiovascular negative cardio ROS       Neuro/Psych  Headaches, negative psych ROS   GI/Hepatic negative GI ROS, Neg liver ROS,   Endo/Other  negative endocrine ROS  Renal/GU negative Renal ROS  negative genitourinary   Musculoskeletal   Abdominal   Peds  Hematology  (+) Blood dyscrasia, anemia ,   Anesthesia Other Findings   Reproductive/Obstetrics negative OB ROS                             Anesthesia Physical Anesthesia Plan  ASA: II and emergent  Anesthesia Plan: General   Post-op Pain Management:    Induction: Intravenous, Rapid sequence and Cricoid pressure planned  PONV Risk Score and Plan: 3 and Ondansetron and Dexamethasone  Airway Management Planned: Oral ETT  Additional Equipment:   Intra-op Plan:   Post-operative Plan: Extubation in OR  Informed Consent: I have reviewed the patients History and Physical, chart, labs and discussed the procedure including the risks, benefits and alternatives for the proposed anesthesia with the patient or authorized representative who has indicated his/her understanding and acceptance.     Dental advisory given  Plan Discussed with: CRNA  Anesthesia Plan Comments:         Anesthesia Quick Evaluation

## 2019-09-19 NOTE — Consult Note (Addendum)
Referring Provider: No ref. provider found Primary Care Physician:  [facility] [place]  Reason for Consultation:  Esophageal foreign body   IMPRESSION AND PLAN:  Retained metal grill bristle present in the prevertebral soft tissues at the level of the thyroid cartilage. The location may be within the upper esophagus.   Discussed with Dr. Sherren Mocha who is on call for ENT. He recommended a transfer to [facility]. The patient is in agreement with this plan.    HPI: Alison Turner is a 29 y.o. female seen in consultation at the request of the Dr. Lyn Hollingshead in the [facility] ED for esophageal foreign body. The patient was sent to the ED earlier at the recommendation of her primary care provider at [facility].  The history is obtained through the patient and review of her electronic health record. She had an ipass procedure 08/07/19, was found to have a right ectopic pregnancy 08/09/19 and was treated with methotrexate 08/09/19. Reports sensitivity to the lidocaine that she received with the ipass and medical anxiety from complications due to recent procedures.   She swallowed a metal bristle from a grill brush when eating a hamburger cooked by her husband around 8pm. She immediately had the sensation that something was lodged on the right side of her throat. When it did not pass despite drinking multiple glasses of water, she called her PCP who directed her to the ED.   She is tolerating her own secretions. No choking, hypersalivation, regurgitation of undigested food, wheezing, or blood-stained saliva. No systemic complaints.  GI ROS is negative except for GERD while pregnant.   I personally reviewed the neck x-ray performed in the ED that shows a potential radiopaque body within the prevertebral soft tissues at the level of the thyroid cartilage and could be within the upper esophagus.   CXR was normal.   Patient does not want to have a CT scan. Worries that she has already had too  many.   Mother recently had pancreatitis and a cholecystectomy.     Past Medical History:  Diagnosis Date  . Anemia   . Headache   . Hypothyroidism    has resolved  . Kidney calculi   . Kidney stones   . Traumatic injury during pregnancy in third trimester 06/19/2015    Past Surgical History:  Procedure Laterality Date  . MOUTH SURGERY      No current facility-administered medications for this encounter.   Current Outpatient Medications  Medication Sig Dispense Refill  . acetaminophen (TYLENOL) 325 MG tablet Take 2 tablets (650 mg total) by mouth every 4 (four) hours as needed (for pain scale < 4). (Patient not taking: Reported on 08/07/2019)    . meclizine (ANTIVERT) 25 MG tablet Take 1 tablet (25 mg total) by mouth 3 (three) times daily as needed for dizziness. 30 tablet 0    Allergies as of 09/18/2019 - Review Complete 09/18/2019  Allergen Reaction Noted  . Lidocaine Itching, Nausea Only, and Other (See Comments) 08/12/2019    Family History  Problem Relation Age of Onset  . Hypertension Mother   . Kidney disease Father   . Cancer Maternal Grandmother   . Diabetes Maternal Grandmother   . Rheum arthritis Maternal Grandmother   . Kidney disease Maternal Grandfather   . Heart disease Maternal Grandfather   . Stroke Maternal Grandfather   . Rheum arthritis Maternal Grandfather   . Hypertension Paternal Grandmother   . Heart disease Paternal Grandmother   .  Stroke Paternal Grandmother   . Rheum arthritis Paternal Grandmother   . Kidney disease Paternal Grandfather   . Heart disease Paternal Grandfather   . Hypertension Paternal Grandfather   . Rheum arthritis Paternal Grandfather   . Healthy Sister   . Healthy Brother   . Healthy Sister   . Healthy Sister   . Healthy Sister   . Healthy Brother   . Healthy Brother     Social History   Socioeconomic History  . Marital status: Married    Spouse name: Jeneen Rinks  . Number of children: 1  . Years of education:  Some college  . Highest education level: Not on file  Occupational History  . Occupation: Stay at home mom  Tobacco Use  . Smoking status: Former Smoker    Types: Pipe, Landscape architect  . Smokeless tobacco: Never Used  Substance and Sexual Activity  . Alcohol use: Not Currently    Comment: Occasionally  . Drug use: Not Currently  . Sexual activity: Yes    Partners: Male    Comment: Married  Other Topics Concern  . Not on file  Social History Narrative   Lives at home w/ her husband and son   Left-hand   Caffeine: 1 cup every other day   Social Determinants of Health   Financial Resource Strain:   . Difficulty of Paying Living Expenses:   Food Insecurity:   . Worried About Charity fundraiser in the Last Year:   . Arboriculturist in the Last Year:   Transportation Needs:   . Film/video editor (Medical):   Marland Kitchen Lack of Transportation (Non-Medical):   Physical Activity:   . Days of Exercise per Week:   . Minutes of Exercise per Session:   Stress:   . Feeling of Stress :   Social Connections:   . Frequency of Communication with Friends and Family:   . Frequency of Social Gatherings with Friends and Family:   . Attends Religious Services:   . Active Member of Clubs or Organizations:   . Attends Archivist Meetings:   Marland Kitchen Marital Status:   Intimate Partner Violence:   . Fear of Current or Ex-Partner:   . Emotionally Abused:   Marland Kitchen Physically Abused:   . Sexually Abused:     Review of Systems: 12 system ROS is negative except as noted above.   Physical Exam: General:   Alert,  well-nourished, pleasant and cooperative in NAD Head:  Normocephalic and atraumatic. Eyes:  Sclera clear, no icterus.   Conjunctiva pink. Ears:  Normal auditory acuity. Nose:  No deformity, discharge,  or lesions. Mouth:  No deformity or lesions.   Neck:  Supple; no masses or thyromegaly. Lungs:  Clear throughout to auscultation.   No wheezes. Heart:  Regular rate and rhythm; no  murmurs. Abdomen:  Soft,nontender, nondistended, normal bowel sounds, no rebound or guarding. No hepatosplenomegaly.   Rectal:  Deferred  Msk:  Symmetrical. No boney deformities LAD: No inguinal or umbilical LAD Extremities:  No clubbing or edema. Neurologic:  Alert and  oriented x4;  grossly nonfocal Skin:  Intact without significant lesions or rashes. Psych:  Alert and cooperative. Normal mood and affect.   Studies/Results: DG Neck Soft Tissue  Result Date: 09/18/2019 CLINICAL DATA:  Swallowed metallic foreign body. EXAM: NECK SOFT TISSUES - 1+ VIEW COMPARISON:  None. FINDINGS: There is a linear density within the prevertebral soft tissues at the level of the thyroid cartilage. This is seen only  on the lateral projection. IMPRESSION: Potential radiopaque foreign body within the prevertebral soft tissues at the level of the thyroid cartilage, seen only on the lateral projection. This could be within the upper esophagus. CT of the neck recommended. Electronically Signed   By: Deatra Robinson M.D.   On: 09/18/2019 22:31   DG Chest 1 View  Result Date: 09/18/2019 CLINICAL DATA:  Foreign body. EXAM: CHEST  1 VIEW COMPARISON:  None. FINDINGS: The heart size and mediastinal contours are within normal limits. Both lungs are clear. The visualized skeletal structures are unremarkable. IMPRESSION: No active disease. Electronically Signed   By: Katherine Mantle M.D.   On: 09/18/2019 22:29      Chaia Ikard L. Orvan Falconer, MD, MPH 09/19/2019, 12:33 AM

## 2019-09-19 NOTE — ED Notes (Signed)
[facility]. Pals Line for Transfer PT To ED @0441 

## 2019-09-19 NOTE — ED Notes (Signed)
Report given to [facility] ED charge nurse.  ?

## 2019-09-20 DIAGNOSIS — E538 Deficiency of other specified B group vitamins: Secondary | ICD-10-CM | POA: Diagnosis not present

## 2019-09-20 DIAGNOSIS — N96 Recurrent pregnancy loss: Secondary | ICD-10-CM | POA: Diagnosis not present

## 2019-09-20 DIAGNOSIS — E039 Hypothyroidism, unspecified: Secondary | ICD-10-CM | POA: Diagnosis not present

## 2019-09-20 DIAGNOSIS — E559 Vitamin D deficiency, unspecified: Secondary | ICD-10-CM | POA: Diagnosis not present

## 2019-09-20 DIAGNOSIS — L219 Seborrheic dermatitis, unspecified: Secondary | ICD-10-CM | POA: Diagnosis not present

## 2019-09-20 DIAGNOSIS — R5383 Other fatigue: Secondary | ICD-10-CM | POA: Diagnosis not present

## 2019-09-20 DIAGNOSIS — K589 Irritable bowel syndrome without diarrhea: Secondary | ICD-10-CM | POA: Diagnosis not present

## 2019-10-02 DIAGNOSIS — Z1159 Encounter for screening for other viral diseases: Secondary | ICD-10-CM | POA: Diagnosis not present

## 2019-10-02 DIAGNOSIS — M795 Residual foreign body in soft tissue: Secondary | ICD-10-CM | POA: Diagnosis not present

## 2019-10-04 DIAGNOSIS — Z09 Encounter for follow-up examination after completed treatment for conditions other than malignant neoplasm: Secondary | ICD-10-CM | POA: Diagnosis not present

## 2019-10-15 DIAGNOSIS — E288 Other ovarian dysfunction: Secondary | ICD-10-CM | POA: Diagnosis not present

## 2019-10-15 DIAGNOSIS — N96 Recurrent pregnancy loss: Secondary | ICD-10-CM | POA: Diagnosis not present

## 2019-10-15 DIAGNOSIS — Z3141 Encounter for fertility testing: Secondary | ICD-10-CM | POA: Diagnosis not present

## 2019-10-16 DIAGNOSIS — E039 Hypothyroidism, unspecified: Secondary | ICD-10-CM | POA: Diagnosis not present

## 2019-10-16 DIAGNOSIS — R5383 Other fatigue: Secondary | ICD-10-CM | POA: Diagnosis not present

## 2019-10-16 DIAGNOSIS — N96 Recurrent pregnancy loss: Secondary | ICD-10-CM | POA: Diagnosis not present

## 2019-10-16 DIAGNOSIS — F8081 Childhood onset fluency disorder: Secondary | ICD-10-CM | POA: Diagnosis not present

## 2019-10-19 DIAGNOSIS — T17208A Unspecified foreign body in pharynx causing other injury, initial encounter: Secondary | ICD-10-CM | POA: Diagnosis not present

## 2019-11-13 DIAGNOSIS — R946 Abnormal results of thyroid function studies: Secondary | ICD-10-CM | POA: Diagnosis not present

## 2019-11-13 DIAGNOSIS — R899 Unspecified abnormal finding in specimens from other organs, systems and tissues: Secondary | ICD-10-CM | POA: Diagnosis not present

## 2019-11-15 DIAGNOSIS — Z3141 Encounter for fertility testing: Secondary | ICD-10-CM | POA: Diagnosis not present

## 2019-11-15 DIAGNOSIS — N856 Intrauterine synechiae: Secondary | ICD-10-CM | POA: Diagnosis not present

## 2019-11-15 DIAGNOSIS — N96 Recurrent pregnancy loss: Secondary | ICD-10-CM | POA: Diagnosis not present

## 2019-12-04 DIAGNOSIS — N856 Intrauterine synechiae: Secondary | ICD-10-CM

## 2019-12-04 HISTORY — DX: Intrauterine synechiae: N85.6

## 2020-02-06 DIAGNOSIS — L719 Rosacea, unspecified: Secondary | ICD-10-CM | POA: Diagnosis not present

## 2020-02-06 DIAGNOSIS — L723 Sebaceous cyst: Secondary | ICD-10-CM | POA: Diagnosis not present

## 2020-02-06 DIAGNOSIS — B078 Other viral warts: Secondary | ICD-10-CM | POA: Diagnosis not present

## 2020-02-08 DIAGNOSIS — Z3141 Encounter for fertility testing: Secondary | ICD-10-CM | POA: Diagnosis not present

## 2020-02-12 DIAGNOSIS — Z3189 Encounter for other procreative management: Secondary | ICD-10-CM | POA: Diagnosis not present

## 2020-02-28 DIAGNOSIS — F411 Generalized anxiety disorder: Secondary | ICD-10-CM | POA: Diagnosis not present

## 2020-02-28 DIAGNOSIS — M6281 Muscle weakness (generalized): Secondary | ICD-10-CM | POA: Diagnosis not present

## 2020-03-03 ENCOUNTER — Ambulatory Visit (INDEPENDENT_AMBULATORY_CARE_PROVIDER_SITE_OTHER): Payer: BC Managed Care – PPO | Admitting: Podiatry

## 2020-03-03 ENCOUNTER — Encounter: Payer: Self-pay | Admitting: Podiatry

## 2020-03-03 ENCOUNTER — Other Ambulatory Visit: Payer: Self-pay

## 2020-03-03 VITALS — BP 143/95 | HR 83 | Temp 97.2°F | Resp 16

## 2020-03-03 DIAGNOSIS — F411 Generalized anxiety disorder: Secondary | ICD-10-CM | POA: Diagnosis not present

## 2020-03-03 DIAGNOSIS — L600 Ingrowing nail: Secondary | ICD-10-CM | POA: Diagnosis not present

## 2020-03-03 DIAGNOSIS — M779 Enthesopathy, unspecified: Secondary | ICD-10-CM | POA: Diagnosis not present

## 2020-03-03 DIAGNOSIS — M6281 Muscle weakness (generalized): Secondary | ICD-10-CM | POA: Diagnosis not present

## 2020-03-03 NOTE — Progress Notes (Signed)
Subjective:   Patient ID: Alison Turner, female   DOB: 29 y.o.   MRN: [id number]   HPI Patient is concerned about a loss of her fifth toenail with history of fungus in her family and also that she gets discomfort across the top of both feet.  States that the nail came off 2 to 3 weeks ago but she does not remember specific injury and patient does not smoke likes to be active   Review of Systems  All other systems reviewed and are negative.       Objective:  Physical Exam Vitals and nursing note reviewed.  Constitutional:      Appearance: She is well-developed.  Pulmonary:     Effort: Pulmonary effort is normal.  Musculoskeletal:        General: Normal range of motion.  Skin:    General: Skin is warm.  Neurological:     Mental Status: She is alert.     Neurovascular status intact with muscle strength adequate range of motion within normal limits.  Patient is noted to have a thickened right fifth nail that did fall off and there is what appears to be a new nail growing it appears to be relatively normal and I did note that she does get some discomfort in the dorsal extensor complex bilateral.  Patient was noted to have good digital perfusion and is well oriented x3     Assessment:  Difficult to say as to whether or not this may be a traumatized nail or fungus but at this point will call a traumatized nail with some rotation of the fifth toe and also low-grade extensor tendinitis     Plan:  Reviewed condition and recommended no treatment for the nail and it will be watched and for the extensor I recommended heat ice and possible diclofenac topical treatment.  Patient will be seen back as needed

## 2020-03-04 ENCOUNTER — Encounter: Payer: Self-pay | Admitting: Neurology

## 2020-03-06 ENCOUNTER — Emergency Department (HOSPITAL_COMMUNITY)
Admission: EM | Admit: 2020-03-06 | Discharge: 2020-03-07 | Disposition: A | Discharge status: Home / Self Care | Payer: BC Managed Care – PPO | Attending: Emergency Medicine | Admitting: Emergency Medicine

## 2020-03-06 ENCOUNTER — Other Ambulatory Visit: Payer: Self-pay

## 2020-03-06 DIAGNOSIS — R202 Paresthesia of skin: Secondary | ICD-10-CM | POA: Insufficient documentation

## 2020-03-06 DIAGNOSIS — Z5321 Procedure and treatment not carried out due to patient leaving prior to being seen by health care provider: Secondary | ICD-10-CM | POA: Insufficient documentation

## 2020-03-06 DIAGNOSIS — G969 Disorder of central nervous system, unspecified: Secondary | ICD-10-CM | POA: Diagnosis not present

## 2020-03-06 LAB — CBC WITH DIFFERENTIAL/PLATELET
Abs Immature Granulocytes: 0.01 10*3/uL (ref 0.00–0.07)
Basophils Absolute: 0 10*3/uL (ref 0.0–0.1)
Basophils Relative: 1 %
Eosinophils Absolute: 0 10*3/uL (ref 0.0–0.5)
Eosinophils Relative: 1 %
HCT: 37.5 % (ref 36.0–46.0)
Hemoglobin: 12.3 g/dL (ref 12.0–15.0)
Immature Granulocytes: 0 %
Lymphocytes Relative: 30 %
Lymphs Abs: 1.2 10*3/uL (ref 0.7–4.0)
MCH: 31.3 pg (ref 26.0–34.0)
MCHC: 32.8 g/dL (ref 30.0–36.0)
MCV: 95.4 fL (ref 80.0–100.0)
Monocytes Absolute: 0.3 10*3/uL (ref 0.1–1.0)
Monocytes Relative: 9 %
Neutro Abs: 2.3 10*3/uL (ref 1.7–7.7)
Neutrophils Relative %: 59 %
Platelets: 275 10*3/uL (ref 150–400)
RBC: 3.93 MIL/uL (ref 3.87–5.11)
RDW: 11.9 % (ref 11.5–15.5)
WBC: 3.8 10*3/uL — ABNORMAL LOW (ref 4.0–10.5)
nRBC: 0 % (ref 0.0–0.2)

## 2020-03-06 LAB — COMPREHENSIVE METABOLIC PANEL
ALT: 13 U/L (ref 0–44)
AST: 17 U/L (ref 15–41)
Albumin: 4.5 g/dL (ref 3.5–5.0)
Alkaline Phosphatase: 55 U/L (ref 38–126)
Anion gap: 9 (ref 5–15)
BUN: 12 mg/dL (ref 6–20)
CO2: 26 mmol/L (ref 22–32)
Calcium: 9.6 mg/dL (ref 8.9–10.3)
Chloride: 103 mmol/L (ref 98–111)
Creatinine, Ser: 0.79 mg/dL (ref 0.44–1.00)
GFR calc Af Amer: >60 mL/min (ref >60)
GFR calc non Af Amer: >60 mL/min (ref >60)
Glucose, Bld: 102 mg/dL — ABNORMAL HIGH (ref 70–99)
Potassium: 3.7 mmol/L (ref 3.5–5.1)
Sodium: 138 mmol/L (ref 135–145)
Total Bilirubin: 0.9 mg/dL (ref 0.3–1.2)
Total Protein: 7.3 g/dL (ref 6.5–8.1)

## 2020-03-06 LAB — I-STAT BETA HCG BLOOD, ED (MC, WL, AP ONLY): I-stat hCG, quantitative: <5 m[IU]/mL (ref <5)

## 2020-03-06 NOTE — ED Triage Notes (Signed)
Patient c/o decreased muscle mass and strength on left lower extremity x 1 month. C/O numbness and twitching as well. Denies PMH.

## 2020-03-06 NOTE — ED Notes (Signed)
Informed patient of triage protocols / imaging studies available and refused CT at this time, stated she'd rather get an MRI. Informed patient assessment by EDP is necessary to order imaging.

## 2020-03-07 DIAGNOSIS — Z3189 Encounter for other procreative management: Secondary | ICD-10-CM | POA: Diagnosis not present

## 2020-03-07 NOTE — ED Notes (Signed)
Pt states she is leaving due to wait time  

## 2020-03-10 DIAGNOSIS — Z3189 Encounter for other procreative management: Secondary | ICD-10-CM | POA: Diagnosis not present

## 2020-03-24 DIAGNOSIS — Z3201 Encounter for pregnancy test, result positive: Secondary | ICD-10-CM | POA: Diagnosis not present

## 2020-03-24 DIAGNOSIS — Z3200 Encounter for pregnancy test, result unknown: Secondary | ICD-10-CM | POA: Diagnosis not present

## 2020-03-26 DIAGNOSIS — N6315 Unspecified lump in the right breast, overlapping quadrants: Secondary | ICD-10-CM | POA: Diagnosis not present

## 2020-03-26 DIAGNOSIS — Z3201 Encounter for pregnancy test, result positive: Secondary | ICD-10-CM | POA: Diagnosis not present

## 2020-03-26 DIAGNOSIS — Z3200 Encounter for pregnancy test, result unknown: Secondary | ICD-10-CM | POA: Diagnosis not present

## 2020-03-28 ENCOUNTER — Other Ambulatory Visit: Payer: Self-pay | Admitting: Obstetrics & Gynecology

## 2020-03-28 DIAGNOSIS — N63 Unspecified lump in unspecified breast: Secondary | ICD-10-CM

## 2020-03-28 DIAGNOSIS — N96 Recurrent pregnancy loss: Secondary | ICD-10-CM | POA: Diagnosis not present

## 2020-03-28 DIAGNOSIS — Z3201 Encounter for pregnancy test, result positive: Secondary | ICD-10-CM | POA: Diagnosis not present

## 2020-03-28 DIAGNOSIS — Z3200 Encounter for pregnancy test, result unknown: Secondary | ICD-10-CM | POA: Diagnosis not present

## 2020-03-28 DIAGNOSIS — E282 Polycystic ovarian syndrome: Secondary | ICD-10-CM | POA: Diagnosis not present

## 2020-03-31 ENCOUNTER — Other Ambulatory Visit: Payer: Self-pay

## 2020-03-31 ENCOUNTER — Ambulatory Visit
Admission: RE | Admit: 2020-03-31 | Discharge: 2020-03-31 | Disposition: A | Discharge status: Home / Self Care | Payer: BC Managed Care – PPO | Source: Ambulatory Visit | Attending: Obstetrics & Gynecology | Admitting: Obstetrics & Gynecology

## 2020-03-31 DIAGNOSIS — N63 Unspecified lump in unspecified breast: Secondary | ICD-10-CM

## 2020-03-31 DIAGNOSIS — N6310 Unspecified lump in the right breast, unspecified quadrant: Secondary | ICD-10-CM | POA: Diagnosis not present

## 2020-04-02 DIAGNOSIS — Z20822 Contact with and (suspected) exposure to covid-19: Secondary | ICD-10-CM | POA: Diagnosis not present

## 2020-04-02 DIAGNOSIS — Z3200 Encounter for pregnancy test, result unknown: Secondary | ICD-10-CM | POA: Diagnosis not present

## 2020-05-12 DIAGNOSIS — O09291 Supervision of pregnancy with other poor reproductive or obstetric history, first trimester: Secondary | ICD-10-CM | POA: Diagnosis not present

## 2020-05-12 DIAGNOSIS — Z3A11 11 weeks gestation of pregnancy: Secondary | ICD-10-CM | POA: Diagnosis not present

## 2020-05-12 DIAGNOSIS — O09299 Supervision of pregnancy with other poor reproductive or obstetric history, unspecified trimester: Secondary | ICD-10-CM | POA: Diagnosis not present

## 2020-05-27 DIAGNOSIS — R070 Pain in throat: Secondary | ICD-10-CM | POA: Diagnosis not present

## 2020-06-04 DIAGNOSIS — L719 Rosacea, unspecified: Secondary | ICD-10-CM | POA: Diagnosis not present

## 2020-06-04 DIAGNOSIS — B078 Other viral warts: Secondary | ICD-10-CM | POA: Diagnosis not present

## 2020-06-05 ENCOUNTER — Encounter: Payer: Self-pay | Admitting: Neurology

## 2020-06-05 ENCOUNTER — Other Ambulatory Visit: Payer: Self-pay

## 2020-06-05 ENCOUNTER — Ambulatory Visit (INDEPENDENT_AMBULATORY_CARE_PROVIDER_SITE_OTHER): Payer: BC Managed Care – PPO | Admitting: Neurology

## 2020-06-05 VITALS — BP 105/72 | HR 105 | Ht 63.0 in | Wt 117.6 lb

## 2020-06-05 DIAGNOSIS — F418 Other specified anxiety disorders: Secondary | ICD-10-CM

## 2020-06-05 NOTE — Patient Instructions (Signed)
Please consider seeing a counselor about anxiety related to health

## 2020-06-05 NOTE — Progress Notes (Signed)
[facility] Clinic Note - Initial Visit   Date: 06/05/20  Alison Turner MRN: [id number] DOB: 05/21/1991   Dear Alison Rumpf, FNP:  Thank you for your kind referral of Alison Turner for consultation of muscle weakness. Although her history is well known to you, please allow [name] to reiterate it for the purpose of our medical record. The patient was accompanied to the clinic by self.    History of Present Illness: Alison Turner is a 29 y.o. left-handed female with bilateral carpal tunnel syndrome and anxiety presenting for evaluation of left sided weakness.  She was told by her chiropractor that her left leg muscle mass was less, as compared to the right side and since this time she has been fixated on measuring her muscles and concerned about the bulk on the left side as compared to the left.  She also feels weaker when she is exercising that side.  No numbness/tingling. She admits that she has high anxiety and can easily become worried about her health.  This started several years ago, when she developed tingling in the hands and her therapist suggested that she may have multiple sclerosis, when she really had carpal tunnel syndrome.  Since this time, she has become highly anxious about her health.    She is [redacted] weeks pregnant with her third child.   Out-side paper records, electronic medical record, and images have been reviewed where available and summarized as:  No results found for: HGBA1C Lab Results  Component Value Date   VITAMINB12 1,293 (H) 05/05/2016   No results found for: TSH Lab Results  Component Value Date   ESRSEDRATE 6 05/05/2016    Past Medical History:  Diagnosis Date   Anemia    Headache    Hypothyroidism    has resolved   Kidney calculi    Kidney stones    Traumatic injury during pregnancy in third trimester 06/19/2015    Past Surgical History:  Procedure Laterality Date   MOUTH SURGERY        Medications:  Outpatient Encounter Medications as of 06/05/2020  Medication Sig   [DISCONTINUED] acetaminophen (TYLENOL) 325 MG tablet Take 2 tablets (650 mg total) by mouth every 4 (four) hours as needed (for pain scale < 4). (Patient not taking: Reported on 08/07/2019)   [DISCONTINUED] hydrOXYzine (ATARAX/VISTARIL) 10 MG tablet Take 10 mg by mouth daily as needed.   [DISCONTINUED] letrozole (FEMARA) 2.5 MG tablet Take by mouth.   [DISCONTINUED] meclizine (ANTIVERT) 25 MG tablet Take 1 tablet (25 mg total) by mouth 3 (three) times daily as needed for dizziness.   [DISCONTINUED] OVIDREL 250 MCG/0.5ML injection Inject into the skin.   [DISCONTINUED] progesterone (PROMETRIUM) 200 MG capsule SMARTSIG:1 Capsule(s) Vaginal   No facility-administered encounter medications on file as of 06/05/2020.    Allergies:  Allergies  Allergen Reactions   Amoxicillin Nausea And Vomiting   Lidocaine Itching, Nausea Only, Other (See Comments) and Nausea And Vomiting    Dizziness, flushed  Dizziness, flushed Dizziness, flushed Dizziness, flushed     Family History: Family History  Problem Relation Age of Onset   Hypertension Mother    Kidney disease Father    Cancer Maternal Grandmother    Diabetes Maternal Grandmother    Rheum arthritis Maternal Grandmother    Kidney disease Maternal Grandfather    Heart disease Maternal Grandfather    Stroke Maternal Grandfather    Rheum arthritis Maternal Grandfather    Hypertension Paternal Grandmother  Heart disease Paternal Grandmother    Stroke Paternal Grandmother    Rheum arthritis Paternal Grandmother    Kidney disease Paternal Grandfather    Heart disease Paternal Grandfather    Hypertension Paternal Grandfather    Rheum arthritis Paternal Grandfather    Healthy Sister    Healthy Brother    Healthy Sister    Healthy Sister    Healthy Sister    Healthy Brother    Healthy Brother     Social  History: Social History   Tobacco Use   Smoking status: Former Smoker    Types: Pipe, Cigars   Smokeless tobacco: Never Used  Building services engineer Use: Never used  Substance Use Topics   Alcohol use: Not Currently    Comment: Occasionally   Drug use: Not Currently   Social History   Social History Narrative   Lives at home w/ her husband and son   Left-hand   Caffeine: 1 cup every other day    Vital Signs:  BP 105/72    Pulse (!) 105    Ht 5\' 3"  (1.6 m)    Wt 117 lb 9.6 oz (53.3 kg)    LMP 04/30/2019 (LMP Unknown)    SpO2 99%    BMI 20.83 kg/m   Neurological Exam: MENTAL STATUS including orientation to time, place, person, recent and remote memory, attention span and concentration, language, and fund of knowledge is normal.  Speech is not dysarthric.  CRANIAL NERVES: II:  No visual field defects. III-IV-VI: Pupils equal round and reactive to light.  Normal conjugate, extra-ocular eye movements in all directions of gaze.  No nystagmus.  No ptosis.   V:  Normal facial sensation.    VII:  Normal facial symmetry and movements.   VIII:  Normal hearing and vestibular function.   IX-X:  Normal palatal movement.   XI:  Normal shoulder shrug and head rotation.   XII:  Normal tongue strength and range of motion, no deviation or fasciculation.  MOTOR:  Muscle bulk preserved throughout. No atrophy, fasciculations or abnormal movements.  No pronator drift.   Upper Extremity:  Right  Left  Deltoid  5/5   5/5   Biceps  5/5   5/5   Triceps  5/5   5/5   Infraspinatus 5/5  5/5  Medial pectoralis 5/5  5/5  Wrist extensors  5/5   5/5   Wrist flexors  5/5   5/5   Finger extensors  5/5   5/5   Finger flexors  5/5   5/5   Dorsal interossei  5/5   5/5   Abductor pollicis  5/5   5/5   Tone (Ashworth scale)  0  0   Lower Extremity:  Right  Left  Hip flexors  5/5   5/5   Hip extensors  5/5   5/5   Adductor 5/5  5/5  Abductor 5/5  5/5  Knee flexors  5/5   5/5   Knee extensors   5/5   5/5   Dorsiflexors  5/5   5/5   Plantarflexors  5/5   5/5   Toe extensors  5/5   5/5   Toe flexors  5/5   5/5   Tone (Ashworth scale)  0  0   MSRs:  Right        Left                  brachioradialis 2+  2+  biceps 2+  2+  triceps 2+  2+  patellar 2+  2+  ankle jerk 2+  2+  Hoffman no  no  plantar response down  down   SENSORY:  Normal and symmetric perception of light touch, pinprick, vibration  COORDINATION/GAIT: Normal finger-to- nose-finger.  Intact rapid alternating movements bilaterally.   Gait narrow based and stable. Tandem and stressed gait intact.   IMPRESSION: Mrs. Suh is a 29 year-old female referred for evaluation of left sided weakness and perceived loss of muscle bulk, which I do not appreciate.  Her neurological exam is entirely normal and non-focal.  Specifically, no focal atrophy, weakness, or other worrisome abnormality.  She endorses significant somatic hypervigilance and anxiety about health.  I reassured patient that I do not see anything abnormal on her exam and no additional testing is indicated.  I encouraged her to see a counselor to help manage her anxiety.  Thank you for allowing me to participate in patient's care.  If I can answer any additional questions, I would be pleased to do so.    Sincerely,    Kjell Brannen K. Allena Katz, DO

## 2020-06-09 ENCOUNTER — Ambulatory Visit: Payer: BC Managed Care – PPO | Admitting: Neurology

## 2020-06-09 DIAGNOSIS — Z3A15 15 weeks gestation of pregnancy: Secondary | ICD-10-CM | POA: Diagnosis not present

## 2020-06-09 DIAGNOSIS — N898 Other specified noninflammatory disorders of vagina: Secondary | ICD-10-CM | POA: Diagnosis not present

## 2020-06-09 DIAGNOSIS — O09292 Supervision of pregnancy with other poor reproductive or obstetric history, second trimester: Secondary | ICD-10-CM | POA: Diagnosis not present

## 2020-06-09 DIAGNOSIS — Z361 Encounter for antenatal screening for raised alphafetoprotein level: Secondary | ICD-10-CM | POA: Diagnosis not present

## 2020-06-11 IMAGING — CR DG CHEST 1V
1 series · 1 of 1 positions shown · non-contrast
Comparison: None.

CLINICAL DATA: Foreign body.

EXAM:
CHEST  1 VIEW

[w chest pa]
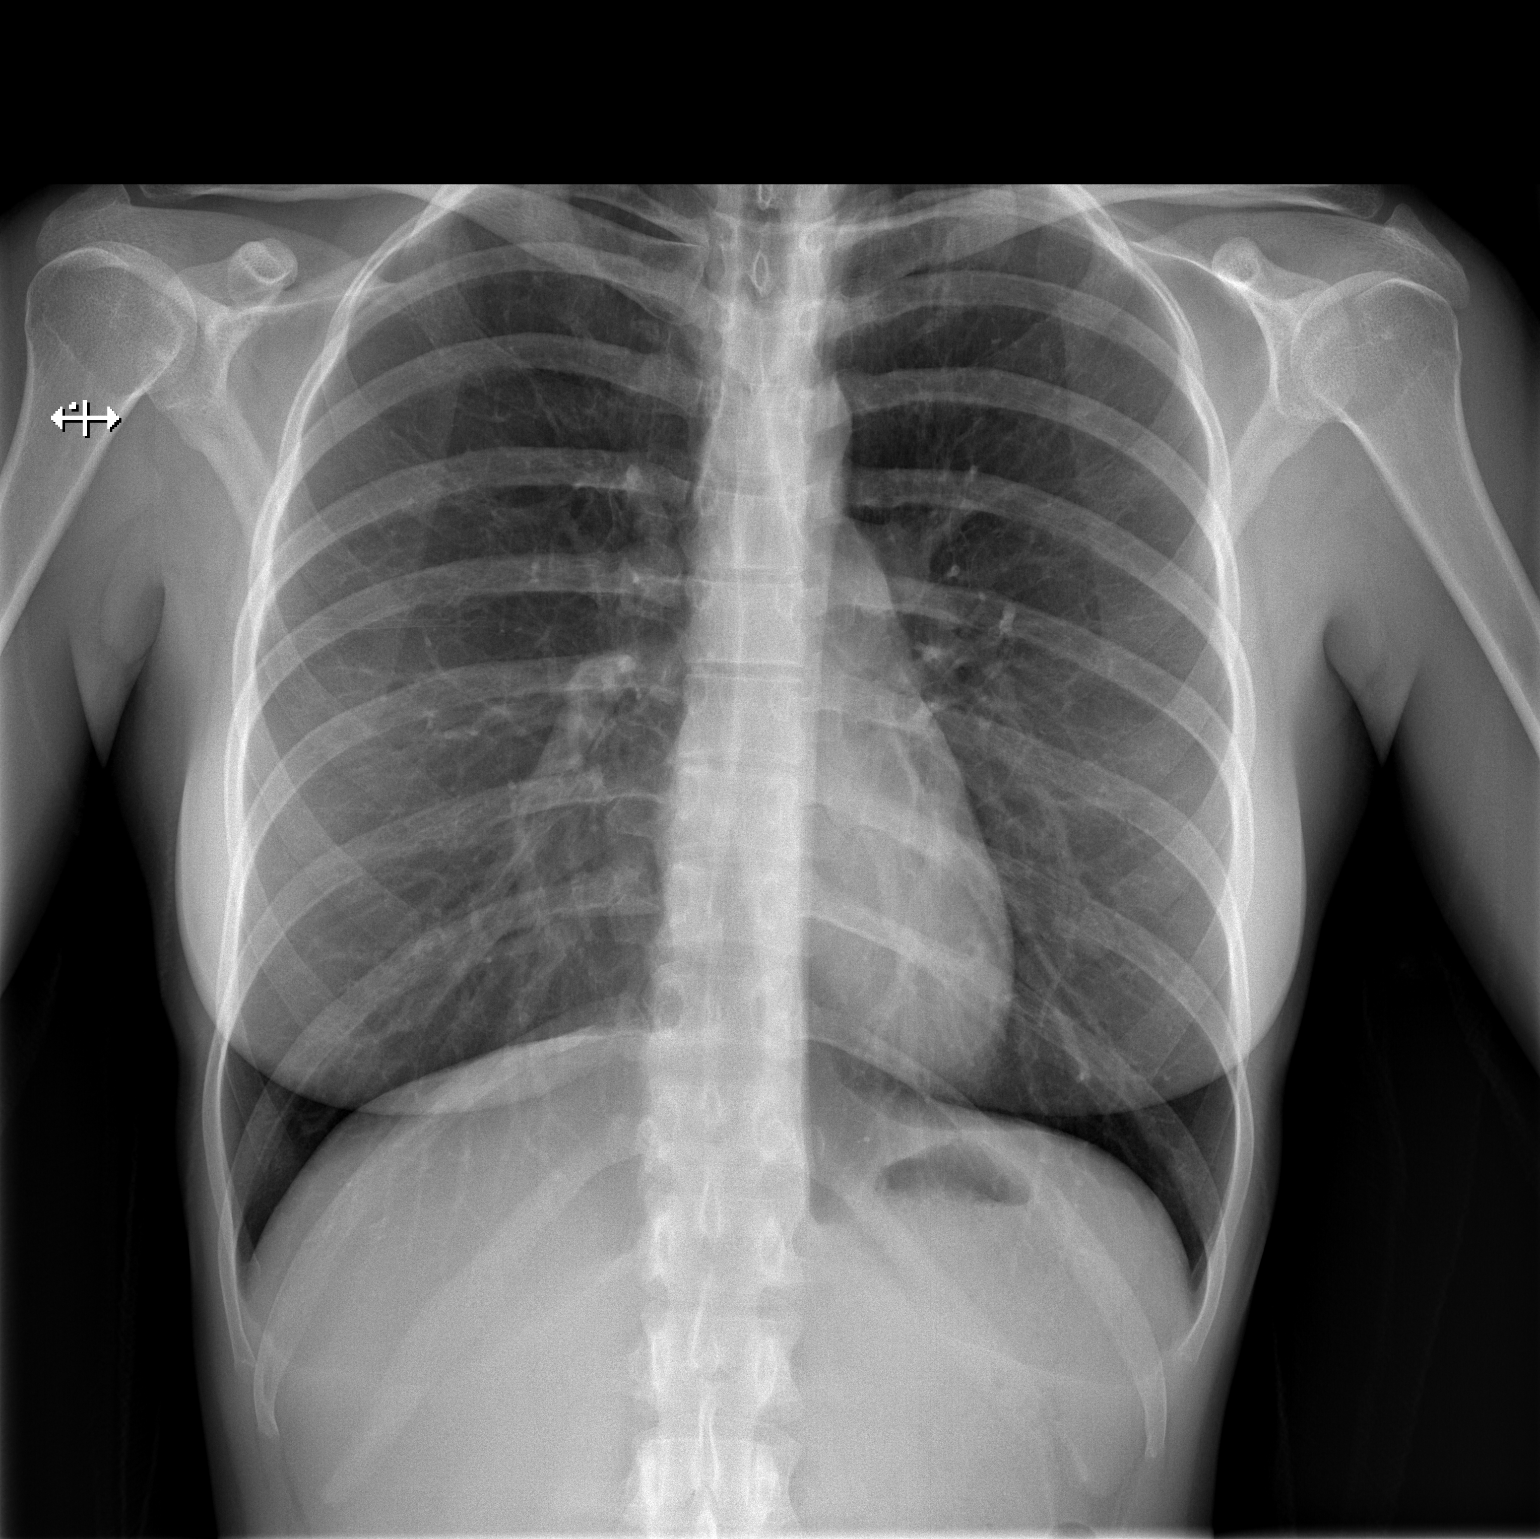

[1 of 1 positions shown; findings below may reference images not displayed]

FINDINGS: The heart size and mediastinal contours are within normal limits.
Both lungs are clear. The visualized skeletal structures are
unremarkable.
IMPRESSION: No active disease.

## 2020-07-03 DIAGNOSIS — Z363 Encounter for antenatal screening for malformations: Secondary | ICD-10-CM | POA: Diagnosis not present

## 2020-07-05 NOTE — L&D Delivery Note (Signed)
Delivery Note:   [id number] at [redacted]w[redacted]d  Admitting diagnosis: Pregnancy [Z34.90] Risks:  1. Hx PTL, + FfN and BMZ x 2 at 30 wks Hx PTB 36.6 wks 2. Hx SGA at 28 wks19%, improved to 26% at 36 wks 3. Anemia, hgb 10, has donor blood on standby in blood bank if needed 4. Hx Asherman's, s/p IUI  First Stage:  Induction of labor: Pitocin started 5/26 at 1500 Onset of labor: 1500 Augmentation: AROM and Pitocin ROM: 2305 clear fluid Active labor onset: 2315 Analgesia /Anesthesia/Pain control intrapartum: Nitrous Oxide gas  Second Stage:  Complete dilation at 11/28/2020  0026 Onset of pushing at 0030 FHR second stage Cat 1  Rapid progression before water tub could be filled up, patient preferred to continue on bed with nitrous gas aid. Pushing in L tilt position with CNM and L&D staff support, Fayrene Fearing and patient's mother present for birth and supportive. Nuchal Cord: No  Delivery of a Live born female  Birth Weight:  pending APGAR: 9, 9  Newborn Delivery   Birth date/time: 11/28/2020 00:45:00 Delivery type: Vaginal, Spontaneous      in cephalic presentation, position OA to ROT. Body delivered by Fayrene Fearing w/ CNM assist. TXA 1 gm given after newborn birth for [facility] prophylaxis.   Cord double clamped after cessation of pulsation, cut by Fayrene Fearing.  Collection of cord blood for typing completed. Cord blood donation-None  Arterial cord blood sample-No    Third Stage:  Placenta delivered-Spontaneous  with 3 vessels . Uterine tone firm, bleeding scant Uterotonics: Pitocin IV bolus Placenta to L&D for disposal.  None  laceration identified.  Episiotomy:None  Local analgesia: NA  Repair:NA Est. Blood Loss (mL):52.00   Complications: None   Mom to postpartum.  Baby Iris or Erin to Couplet care / Skin to Skin.  Delivery Report:  Review the Delivery Report for details.     Signed: Neta Mends, CNM, MSN 11/28/2020, 1:17 AM

## 2020-07-23 DIAGNOSIS — Z3A21 21 weeks gestation of pregnancy: Secondary | ICD-10-CM | POA: Diagnosis not present

## 2020-07-23 DIAGNOSIS — O09292 Supervision of pregnancy with other poor reproductive or obstetric history, second trimester: Secondary | ICD-10-CM | POA: Diagnosis not present

## 2020-08-07 DIAGNOSIS — Z043 Encounter for examination and observation following other accident: Secondary | ICD-10-CM | POA: Diagnosis not present

## 2020-08-20 DIAGNOSIS — F411 Generalized anxiety disorder: Secondary | ICD-10-CM | POA: Diagnosis not present

## 2020-09-03 DIAGNOSIS — F411 Generalized anxiety disorder: Secondary | ICD-10-CM | POA: Diagnosis not present

## 2020-09-09 DIAGNOSIS — Z8759 Personal history of other complications of pregnancy, childbirth and the puerperium: Secondary | ICD-10-CM | POA: Diagnosis not present

## 2020-09-09 DIAGNOSIS — Z3A28 28 weeks gestation of pregnancy: Secondary | ICD-10-CM | POA: Diagnosis not present

## 2020-09-09 DIAGNOSIS — O09812 Supervision of pregnancy resulting from assisted reproductive technology, second trimester: Secondary | ICD-10-CM | POA: Diagnosis not present

## 2020-09-09 DIAGNOSIS — Z3689 Encounter for other specified antenatal screening: Secondary | ICD-10-CM | POA: Diagnosis not present

## 2020-09-09 DIAGNOSIS — Z87440 Personal history of urinary (tract) infections: Secondary | ICD-10-CM | POA: Diagnosis not present

## 2020-09-22 DIAGNOSIS — Z043 Encounter for examination and observation following other accident: Secondary | ICD-10-CM | POA: Diagnosis not present

## 2020-09-22 DIAGNOSIS — O368199 Decreased fetal movements, unspecified trimester, other fetus: Secondary | ICD-10-CM | POA: Diagnosis not present

## 2020-09-22 DIAGNOSIS — Z3A30 30 weeks gestation of pregnancy: Secondary | ICD-10-CM | POA: Diagnosis not present

## 2020-09-23 DIAGNOSIS — O479 False labor, unspecified: Secondary | ICD-10-CM | POA: Diagnosis not present

## 2020-09-25 DIAGNOSIS — M545 Low back pain, unspecified: Secondary | ICD-10-CM | POA: Diagnosis not present

## 2020-09-25 DIAGNOSIS — M5432 Sciatica, left side: Secondary | ICD-10-CM | POA: Diagnosis not present

## 2020-09-25 DIAGNOSIS — M9901 Segmental and somatic dysfunction of cervical region: Secondary | ICD-10-CM | POA: Diagnosis not present

## 2020-09-25 DIAGNOSIS — M5431 Sciatica, right side: Secondary | ICD-10-CM | POA: Diagnosis not present

## 2020-09-25 DIAGNOSIS — Z8751 Personal history of pre-term labor: Secondary | ICD-10-CM | POA: Diagnosis not present

## 2020-09-26 DIAGNOSIS — O09213 Supervision of pregnancy with history of pre-term labor, third trimester: Secondary | ICD-10-CM | POA: Diagnosis not present

## 2020-09-26 DIAGNOSIS — Z3A30 30 weeks gestation of pregnancy: Secondary | ICD-10-CM | POA: Diagnosis not present

## 2020-10-02 DIAGNOSIS — M545 Low back pain, unspecified: Secondary | ICD-10-CM | POA: Diagnosis not present

## 2020-10-02 DIAGNOSIS — M5431 Sciatica, right side: Secondary | ICD-10-CM | POA: Diagnosis not present

## 2020-10-02 DIAGNOSIS — O4703 False labor before 37 completed weeks of gestation, third trimester: Secondary | ICD-10-CM | POA: Diagnosis not present

## 2020-10-02 DIAGNOSIS — M5432 Sciatica, left side: Secondary | ICD-10-CM | POA: Diagnosis not present

## 2020-10-02 DIAGNOSIS — Z3A31 31 weeks gestation of pregnancy: Secondary | ICD-10-CM | POA: Diagnosis not present

## 2020-10-02 DIAGNOSIS — M9901 Segmental and somatic dysfunction of cervical region: Secondary | ICD-10-CM | POA: Diagnosis not present

## 2020-10-02 DIAGNOSIS — O09213 Supervision of pregnancy with history of pre-term labor, third trimester: Secondary | ICD-10-CM | POA: Diagnosis not present

## 2020-10-03 ENCOUNTER — Inpatient Hospital Stay (HOSPITAL_COMMUNITY)
Admission: AD | Admit: 2020-10-03 | Discharge: 2020-10-03 | Disposition: A | Discharge status: Home / Self Care | Payer: BC Managed Care – PPO | Attending: Obstetrics | Admitting: Obstetrics

## 2020-10-03 ENCOUNTER — Encounter (HOSPITAL_COMMUNITY): Payer: Self-pay | Admitting: Obstetrics

## 2020-10-03 ENCOUNTER — Other Ambulatory Visit: Payer: Self-pay

## 2020-10-03 ENCOUNTER — Inpatient Hospital Stay (HOSPITAL_BASED_OUTPATIENT_CLINIC_OR_DEPARTMENT_OTHER): Payer: BC Managed Care – PPO

## 2020-10-03 DIAGNOSIS — Z3689 Encounter for other specified antenatal screening: Secondary | ICD-10-CM | POA: Diagnosis not present

## 2020-10-03 DIAGNOSIS — Z3A31 31 weeks gestation of pregnancy: Secondary | ICD-10-CM | POA: Diagnosis not present

## 2020-10-03 DIAGNOSIS — Z79899 Other long term (current) drug therapy: Secondary | ICD-10-CM | POA: Insufficient documentation

## 2020-10-03 DIAGNOSIS — O368130 Decreased fetal movements, third trimester, not applicable or unspecified: Secondary | ICD-10-CM | POA: Diagnosis not present

## 2020-10-03 DIAGNOSIS — Z87891 Personal history of nicotine dependence: Secondary | ICD-10-CM | POA: Insufficient documentation

## 2020-10-03 DIAGNOSIS — O368190 Decreased fetal movements, unspecified trimester, not applicable or unspecified: Secondary | ICD-10-CM

## 2020-10-03 NOTE — MAU Note (Signed)
Pt ted she has not felt baby move much since yesterday. Had OB appointment yesterday and was told if she she did not feel baby move much to come in and check it out. Reports some abd tenderness occasionally that she thinks are mild ctx. Denies any vag bleeding or leaking at this time

## 2020-10-03 NOTE — MAU Provider Note (Signed)
History     CSN: [id number]  Arrival date and time: 10/03/20 1831   Event Date/Time   First Provider Initiated Contact with Patient 10/03/20 1909      Chief Complaint  Patient presents with  . Decreased Fetal Movement   30 y.o. [id number] @31 .4 wks presenting for decreased FM. Reports less FM since yesterday. She feels well otherwise. Denies VB, LOF, or regular ctx. Has occ BH ctx.    OB History    Gravida  7   Para  2   Term  1   Preterm  1   AB  3   Living  2     SAB  3   IAB      Ectopic  0   Multiple  1   Live Births  2           Past Medical History:  Diagnosis Date  . Anemia   . Headache   . Hypothyroidism    has resolved  . Kidney calculi   . Kidney stones   . Traumatic injury during pregnancy in third trimester 06/19/2015    Past Surgical History:  Procedure Laterality Date  . MOUTH SURGERY      Family History  Problem Relation Age of Onset  . Hypertension Mother   . Kidney disease Father   . Cancer Maternal Grandmother   . Diabetes Maternal Grandmother   . Rheum arthritis Maternal Grandmother   . Kidney disease Maternal Grandfather   . Heart disease Maternal Grandfather   . Stroke Maternal Grandfather   . Rheum arthritis Maternal Grandfather   . Hypertension Paternal Grandmother   . Heart disease Paternal Grandmother   . Stroke Paternal Grandmother   . Rheum arthritis Paternal Grandmother   . Kidney disease Paternal Grandfather   . Heart disease Paternal Grandfather   . Hypertension Paternal Grandfather   . Rheum arthritis Paternal Grandfather   . Healthy Sister   . Healthy Brother   . Healthy Sister   . Healthy Sister   . Healthy Sister   . Healthy Brother   . Healthy Brother     Social History   Tobacco Use  . Smoking status: Former Smoker    Types: Pipe, 06/21/2015  . Smokeless tobacco: Never Used  Vaping Use  . Vaping Use: Never used  Substance Use Topics  . Alcohol use: Not Currently    Comment: Occasionally   . Drug use: Not Currently    Allergies:  Allergies  Allergen Reactions  . Amoxicillin Nausea And Vomiting  . Lidocaine Itching, Nausea Only, Other (See Comments) and Nausea And Vomiting    Dizziness, flushed  Dizziness, flushed Dizziness, flushed Dizziness, flushed     Medications Prior to Admission  Medication Sig Dispense Refill Last Dose  . Prenatal Vit-Fe Fumarate-FA (PRENATAL MULTIVITAMIN) TABS tablet Take 1 tablet by mouth daily at 12 noon.       Review of Systems  Gastrointestinal: Negative for abdominal pain.  Genitourinary: Negative for vaginal bleeding.   Physical Exam   Blood pressure 103/68, pulse 72, temperature 98.2 F (36.8 C), resp. rate 18, unknown if currently breastfeeding.  Physical Exam Vitals and nursing note reviewed.  Constitutional:      General: She is not in acute distress.    Appearance: Normal appearance.  HENT:     Head: Normocephalic and atraumatic.  Cardiovascular:     Rate and Rhythm: Normal rate.  Pulmonary:     Effort: Pulmonary effort is normal. No respiratory  distress.  Abdominal:     Tenderness: There is no abdominal tenderness.     Comments: gravid  Musculoskeletal:        General: Normal range of motion.     Cervical back: Normal range of motion.  Skin:    General: Skin is warm and dry.  Neurological:     General: No focal deficit present.     Mental Status: She is alert and oriented to person, place, and time.  Psychiatric:        Mood and Affect: Mood normal.        Behavior: Behavior normal.   EFM: 135 bpm, mod variability, + accels, no decels Toco: none  No results found for this or any previous visit (from the past 24 hour(s)).  MAU Course  Procedures  MDM NST reactive, feeling good FM, marked >20 times. BPP 8/8, nml AFI, cephalic. Pt reassured. Stable for discharge home.   Assessment and Plan   1. [redacted] weeks gestation of pregnancy   2. Decreased fetal movement   3. NST (non-stress test) reactive     Discharge home Follow up at [facility] next week as scheduled [facility]  Allergies as of 10/03/2020      Reactions   Amoxicillin Nausea And Vomiting   Lidocaine Itching, Nausea Only, Other (See Comments), Nausea And Vomiting   Dizziness, flushed Dizziness, flushed Dizziness, flushed Dizziness, flushed      Medication List    TAKE these medications   prenatal multivitamin Tabs tablet Take 1 tablet by mouth daily at 12 noon.      Donette Larry, CNM 10/03/2020, 8:26 PM

## 2020-10-03 NOTE — Discharge Instructions (Signed)
Fetal Movement Counts Patient Name: ________________________________________________ Patient Due Date: ____________________  What is a fetal movement count? A fetal movement count is the number of times that you feel your baby move during a certain amount of time. This may also be called a fetal kick count. A fetal movement count is recommended for every pregnant woman. You may be asked to start counting fetal movements as early as week 28 of your pregnancy. Pay attention to when your baby is most active. You may notice your baby's sleep and wake cycles. You may also notice things that make your baby move more. You should do a fetal movement count:  When your baby is normally most active.  At the same time each day. A good time to count movements is while you are resting, after having something to eat and drink. How do I count fetal movements? 1. Find a quiet, comfortable area. Sit, or lie down on your side. 2. Write down the date, the start time and stop time, and the number of movements that you felt between those two times. Take this information with you to your health care visits. 3. Write down your start time when you feel the first movement. 4. Count kicks, flutters, swishes, rolls, and jabs. You should feel at least 10 movements. 5. You may stop counting after you have felt 10 movements, or if you have been counting for 2 hours. Write down the stop time. 6. If you do not feel 10 movements in 2 hours, contact your health care provider for further instructions. Your health care provider may want to do additional tests to assess your baby's well-being. Contact a health care provider if:  You feel fewer than 10 movements in 2 hours.  Your baby is not moving like he or she usually does. Date: ____________ Start time: ____________ Stop time: ____________ Movements: ____________ Date: ____________ Start time: ____________ Stop time: ____________ Movements: ____________ Date: ____________  Start time: ____________ Stop time: ____________ Movements: ____________ Date: ____________ Start time: ____________ Stop time: ____________ Movements: ____________ Date: ____________ Start time: ____________ Stop time: ____________ Movements: ____________ Date: ____________ Start time: ____________ Stop time: ____________ Movements: ____________ Date: ____________ Start time: ____________ Stop time: ____________ Movements: ____________ Date: ____________ Start time: ____________ Stop time: ____________ Movements: ____________ Date: ____________ Start time: ____________ Stop time: ____________ Movements: ____________ This information is not intended to replace advice given to you by your health care provider. Make sure you discuss any questions you have with your health care provider. Document Revised: 02/08/2019 Document Reviewed: 02/08/2019 Elsevier Patient Education  2021 [employer]  

## 2020-10-04 DIAGNOSIS — Z3A31 31 weeks gestation of pregnancy: Secondary | ICD-10-CM | POA: Diagnosis not present

## 2020-10-04 DIAGNOSIS — O368130 Decreased fetal movements, third trimester, not applicable or unspecified: Secondary | ICD-10-CM | POA: Diagnosis not present

## 2020-10-09 DIAGNOSIS — M5431 Sciatica, right side: Secondary | ICD-10-CM | POA: Diagnosis not present

## 2020-10-09 DIAGNOSIS — M9901 Segmental and somatic dysfunction of cervical region: Secondary | ICD-10-CM | POA: Diagnosis not present

## 2020-10-09 DIAGNOSIS — M545 Low back pain, unspecified: Secondary | ICD-10-CM | POA: Diagnosis not present

## 2020-10-09 DIAGNOSIS — M5432 Sciatica, left side: Secondary | ICD-10-CM | POA: Diagnosis not present

## 2020-10-14 DIAGNOSIS — M5431 Sciatica, right side: Secondary | ICD-10-CM | POA: Diagnosis not present

## 2020-10-14 DIAGNOSIS — M545 Low back pain, unspecified: Secondary | ICD-10-CM | POA: Diagnosis not present

## 2020-10-14 DIAGNOSIS — M5432 Sciatica, left side: Secondary | ICD-10-CM | POA: Diagnosis not present

## 2020-10-14 DIAGNOSIS — M9901 Segmental and somatic dysfunction of cervical region: Secondary | ICD-10-CM | POA: Diagnosis not present

## 2020-10-16 DIAGNOSIS — Z3A33 33 weeks gestation of pregnancy: Secondary | ICD-10-CM | POA: Diagnosis not present

## 2020-10-16 DIAGNOSIS — O26843 Uterine size-date discrepancy, third trimester: Secondary | ICD-10-CM | POA: Diagnosis not present

## 2020-10-23 DIAGNOSIS — M545 Low back pain, unspecified: Secondary | ICD-10-CM | POA: Diagnosis not present

## 2020-10-23 DIAGNOSIS — M5432 Sciatica, left side: Secondary | ICD-10-CM | POA: Diagnosis not present

## 2020-10-23 DIAGNOSIS — M9901 Segmental and somatic dysfunction of cervical region: Secondary | ICD-10-CM | POA: Diagnosis not present

## 2020-10-23 DIAGNOSIS — M5431 Sciatica, right side: Secondary | ICD-10-CM | POA: Diagnosis not present

## 2020-10-27 DIAGNOSIS — Z3A35 35 weeks gestation of pregnancy: Secondary | ICD-10-CM | POA: Diagnosis not present

## 2020-10-27 DIAGNOSIS — O09293 Supervision of pregnancy with other poor reproductive or obstetric history, third trimester: Secondary | ICD-10-CM | POA: Diagnosis not present

## 2020-10-30 DIAGNOSIS — M5431 Sciatica, right side: Secondary | ICD-10-CM | POA: Diagnosis not present

## 2020-10-30 DIAGNOSIS — M9901 Segmental and somatic dysfunction of cervical region: Secondary | ICD-10-CM | POA: Diagnosis not present

## 2020-10-30 DIAGNOSIS — M5432 Sciatica, left side: Secondary | ICD-10-CM | POA: Diagnosis not present

## 2020-10-30 DIAGNOSIS — M545 Low back pain, unspecified: Secondary | ICD-10-CM | POA: Diagnosis not present

## 2020-11-03 ENCOUNTER — Other Ambulatory Visit: Payer: Self-pay

## 2020-11-03 ENCOUNTER — Encounter (HOSPITAL_COMMUNITY): Payer: Self-pay | Admitting: Obstetrics and Gynecology

## 2020-11-03 ENCOUNTER — Inpatient Hospital Stay (HOSPITAL_COMMUNITY)
Admission: AD | Admit: 2020-11-03 | Discharge: 2020-11-03 | Disposition: A | Discharge status: Home / Self Care | Payer: BC Managed Care – PPO | Attending: Obstetrics and Gynecology | Admitting: Obstetrics and Gynecology

## 2020-11-03 DIAGNOSIS — O479 False labor, unspecified: Secondary | ICD-10-CM

## 2020-11-03 DIAGNOSIS — Z3A36 36 weeks gestation of pregnancy: Secondary | ICD-10-CM | POA: Diagnosis not present

## 2020-11-03 DIAGNOSIS — O4703 False labor before 37 completed weeks of gestation, third trimester: Secondary | ICD-10-CM | POA: Diagnosis not present

## 2020-11-03 HISTORY — DX: Unspecified ectopic pregnancy without intrauterine pregnancy: O00.90

## 2020-11-03 HISTORY — DX: Unspecified ovarian cyst, unspecified side: N83.209

## 2020-11-03 HISTORY — DX: Unspecified infectious disease: B99.9

## 2020-11-03 NOTE — MAU Note (Signed)
Has been having regular light contractions, and some spotting. Does not think she is labor, but just wanted to get checked out. Denies placental issues. +FM. Has been on pelvic rest for PTL.

## 2020-11-03 NOTE — Discharge Instructions (Signed)
https://www.nichd.nih.gov/health/topics/labor-delivery/Pages/default.aspx">  Third Trimester of Pregnancy  The third trimester of pregnancy is from week 28 through week 40. This is months 7 through 9. The third trimester is a time when the unborn baby (fetus) is growing rapidly. At the end of the ninth month, the fetus is about 20 inches long and weighs 6-10 pounds. Body changes during your third trimester During the third trimester, your body will continue to go through many changes. The changes vary and generally return to normal after your baby is born. Physical changes  Your weight will continue to increase. You can expect to gain 25-35 pounds (11-16 kg) by the end of the pregnancy if you begin pregnancy at a normal weight. If you are underweight, you can expect to gain 28-40 lb (about 13-18 kg), and if you are overweight, you can expect to gain 15-25 lb (about 7-11 kg).  You may begin to get stretch marks on your hips, abdomen, and breasts.  Your breasts will continue to grow and may hurt. A yellow fluid (colostrum) may leak from your breasts. This is the first milk you are producing for your baby.  You may have changes in your hair. These can include thickening of your hair, rapid growth, and changes in texture. Some people also have hair loss during or after pregnancy, or hair that feels dry or thin.  Your belly button may stick out.  You may notice more swelling in your hands, face, or ankles. Health changes  You may have heartburn.  You may have constipation.  You may develop hemorrhoids.  You may develop swollen, bulging veins (varicose veins) in your legs.  You may have increased body aches in the pelvis, back, or thighs. This is due to weight gain and increased hormones that are relaxing your joints.  You may have increased tingling or numbness in your hands, arms, and legs. The skin on your abdomen may also feel numb.  You may feel short of breath because of your  expanding uterus. Other changes  You may urinate more often because the fetus is moving lower into your pelvis and pressing on your bladder.  You may have more problems sleeping. This may be caused by the size of your abdomen, an increased need to urinate, and an increase in your body's metabolism.  You may notice the fetus "dropping," or moving lower in your abdomen (lightening).  You may have increased vaginal discharge.  You may notice that you have pain around your pelvic bone as your uterus distends. Follow these instructions at home: Medicines  Follow your health care provider's instructions regarding medicine use. Specific medicines may be either safe or unsafe to take during pregnancy. Do not take any medicines unless approved by your health care provider.  Take a prenatal vitamin that contains at least 600 micrograms (mcg) of folic acid. Eating and drinking  Eat a healthy diet that includes fresh fruits and vegetables, whole grains, good sources of protein such as meat, eggs, or tofu, and low-fat dairy products.  Avoid raw meat and unpasteurized juice, milk, and cheese. These carry germs that can harm you and your baby.  Eat 4 or 5 small meals rather than 3 large meals a day.  You may need to take these actions to prevent or treat constipation: ? Drink enough fluid to keep your urine pale yellow. ? Eat foods that are high in fiber, such as beans, whole grains, and fresh fruits and vegetables. ? Limit foods that are high in fat and   processed sugars, such as fried or sweet foods. Activity  Exercise only as directed by your health care provider. Most people can continue their usual exercise routine during pregnancy. Try to exercise for 30 minutes at least 5 days a week. Stop exercising if you experience contractions in the uterus.  Stop exercising if you develop pain or cramping in the lower abdomen or lower back.  Avoid heavy lifting.  Do not exercise if it is very hot or  humid or if you are at a high altitude.  If you choose to, you may continue to have sex unless your health care provider tells you not to. Relieving pain and discomfort  Take frequent breaks and rest with your legs raised (elevated) if you have leg cramps or low back pain.  Take warm sitz baths to soothe any pain or discomfort caused by hemorrhoids. Use hemorrhoid cream if your health care provider approves.  Wear a supportive bra to prevent discomfort from breast tenderness.  If you develop varicose veins: ? Wear support hose as told by your health care provider. ? Elevate your feet for 15 minutes, 3-4 times a day. ? Limit salt in your diet. Safety  Talk to your health care provider before traveling far distances.  Do not use hot tubs, steam rooms, or saunas.  Wear your seat belt at all times when driving or riding in a car.  Talk with your health care provider if someone is verbally or physically abusive to you. Preparing for birth To prepare for the arrival of your baby:  Take prenatal classes to understand, practice, and ask questions about labor and delivery.  Visit the hospital and tour the maternity area.  Purchase a rear-facing car seat and make sure you know how to install it in your car.  Prepare the baby's room or sleeping area. Make sure to remove all pillows and stuffed animals from the baby's crib to prevent suffocation. General instructions  Avoid cat litter boxes and soil used by cats. These carry germs that can cause birth defects in the baby. If you have a cat, ask someone to clean the litter box for you.  Do not douche or use tampons. Do not use scented sanitary pads.  Do not use any products that contain nicotine or tobacco, such as cigarettes, e-cigarettes, and chewing tobacco. If you need help quitting, ask your health care provider.  Do not use any herbal remedies, illegal drugs, or medicines that were not prescribed to you. Chemicals in these products  can harm your baby.  Do not drink alcohol.  You will have more frequent prenatal exams during the third trimester. During a routine prenatal visit, your health care provider will do a physical exam, perform tests, and discuss your overall health. Keep all follow-up visits. This is important. Where to find more information  American Pregnancy Association: americanpregnancy.org  American College of Obstetricians and Gynecologists: acog.org/en/Womens%20Health/Pregnancy  Office on Women's Health: womenshealth.gov/pregnancy Contact a health care provider if you have:  A fever.  Mild pelvic cramps, pelvic pressure, or nagging pain in your abdominal area or lower back.  Vomiting or diarrhea.  Bad-smelling vaginal discharge or foul-smelling urine.  Pain when you urinate.  A headache that does not go away when you take medicine.  Visual changes or see spots in front of your eyes. Get help right away if:  Your water breaks.  You have regular contractions less than 5 minutes apart.  You have spotting or bleeding from your vagina.  You   have severe abdominal pain.  You have difficulty breathing.  You have chest pain.  You have fainting spells.  You have not felt your baby move for the time period told by your health care provider.  You have new or increased pain, swelling, or redness in an arm or leg. Summary  The third trimester of pregnancy is from week 28 through week 40 (months 7 through 9).  You may have more problems sleeping. This can be caused by the size of your abdomen, an increased need to urinate, and an increase in your body's metabolism.  You will have more frequent prenatal exams during the third trimester. Keep all follow-up visits. This is important. This information is not intended to replace advice given to you by your health care provider. Make sure you discuss any questions you have with your health care provider. Document Revised: 11/28/2019 Document  Reviewed: 10/04/2019 Elsevier Patient Education  2021 [employer]. Fetal Movement Counts Patient Name: ________________________________________________ Patient Due Date: ____________________  What is a fetal movement count? A fetal movement count is the number of times that you feel your baby move during a certain amount of time. This may also be called a fetal kick count. A fetal movement count is recommended for every pregnant woman. You may be asked to start counting fetal movements as early as week 28 of your pregnancy. Pay attention to when your baby is most active. You may notice your baby's sleep and wake cycles. You may also notice things that make your baby move more. You should do a fetal movement count:  When your baby is normally most active.  At the same time each day. A good time to count movements is while you are resting, after having something to eat and drink. How do I count fetal movements? 1. Find a quiet, comfortable area. Sit, or lie down on your side. 2. Write down the date, the start time and stop time, and the number of movements that you felt between those two times. Take this information with you to your health care visits. 3. Write down your start time when you feel the first movement. 4. Count kicks, flutters, swishes, rolls, and jabs. You should feel at least 10 movements. 5. You may stop counting after you have felt 10 movements, or if you have been counting for 2 hours. Write down the stop time. 6. If you do not feel 10 movements in 2 hours, contact your health care provider for further instructions. Your health care provider may want to do additional tests to assess your baby's well-being. Contact a health care provider if:  You feel fewer than 10 movements in 2 hours.  Your baby is not moving like he or she usually does. Date: ____________ Start time: ____________ Stop time: ____________ Movements: ____________ Date: ____________ Start time: ____________  Stop time: ____________ Movements: ____________ Date: ____________ Start time: ____________ Stop time: ____________ Movements: ____________ Date: ____________ Start time: ____________ Stop time: ____________ Movements: ____________ Date: ____________ Start time: ____________ Stop time: ____________ Movements: ____________ Date: ____________ Start time: ____________ Stop time: ____________ Movements: ____________ Date: ____________ Start time: ____________ Stop time: ____________ Movements: ____________ Date: ____________ Start time: ____________ Stop time: ____________ Movements: ____________ Date: ____________ Start time: ____________ Stop time: ____________ Movements: ____________ This information is not intended to replace advice given to you by your health care provider. Make sure you discuss any questions you have with your health care provider. Document Revised: 02/08/2019 Document Reviewed: 02/08/2019 Elsevier Patient Education  [place].

## 2020-11-04 DIAGNOSIS — Z3A36 36 weeks gestation of pregnancy: Secondary | ICD-10-CM | POA: Diagnosis not present

## 2020-11-04 DIAGNOSIS — O26843 Uterine size-date discrepancy, third trimester: Secondary | ICD-10-CM | POA: Diagnosis not present

## 2020-11-04 DIAGNOSIS — Z3685 Encounter for antenatal screening for Streptococcus B: Secondary | ICD-10-CM | POA: Diagnosis not present

## 2020-11-06 DIAGNOSIS — M5432 Sciatica, left side: Secondary | ICD-10-CM | POA: Diagnosis not present

## 2020-11-06 DIAGNOSIS — M545 Low back pain, unspecified: Secondary | ICD-10-CM | POA: Diagnosis not present

## 2020-11-06 DIAGNOSIS — M9901 Segmental and somatic dysfunction of cervical region: Secondary | ICD-10-CM | POA: Diagnosis not present

## 2020-11-06 DIAGNOSIS — M5431 Sciatica, right side: Secondary | ICD-10-CM | POA: Diagnosis not present

## 2020-11-12 DIAGNOSIS — R5383 Other fatigue: Secondary | ICD-10-CM | POA: Diagnosis not present

## 2020-11-19 DIAGNOSIS — Z3A38 38 weeks gestation of pregnancy: Secondary | ICD-10-CM | POA: Diagnosis not present

## 2020-11-19 DIAGNOSIS — O368130 Decreased fetal movements, third trimester, not applicable or unspecified: Secondary | ICD-10-CM | POA: Diagnosis not present

## 2020-11-19 DIAGNOSIS — O368193 Decreased fetal movements, unspecified trimester, fetus 3: Secondary | ICD-10-CM | POA: Diagnosis not present

## 2020-11-26 ENCOUNTER — Encounter (HOSPITAL_COMMUNITY): Payer: Self-pay | Admitting: Obstetrics and Gynecology

## 2020-11-26 ENCOUNTER — Inpatient Hospital Stay (HOSPITAL_COMMUNITY)
Admission: AD | Admit: 2020-11-26 | Discharge: 2020-11-26 | Disposition: A | Discharge status: Home / Self Care | Payer: BC Managed Care – PPO | Source: Home / Self Care | Attending: Obstetrics and Gynecology | Admitting: Obstetrics and Gynecology

## 2020-11-26 ENCOUNTER — Other Ambulatory Visit: Payer: Self-pay

## 2020-11-26 DIAGNOSIS — O471 False labor at or after 37 completed weeks of gestation: Secondary | ICD-10-CM | POA: Diagnosis not present

## 2020-11-26 DIAGNOSIS — Z3A39 39 weeks gestation of pregnancy: Secondary | ICD-10-CM | POA: Insufficient documentation

## 2020-11-26 DIAGNOSIS — Z20822 Contact with and (suspected) exposure to covid-19: Secondary | ICD-10-CM | POA: Diagnosis not present

## 2020-11-26 DIAGNOSIS — Z3403 Encounter for supervision of normal first pregnancy, third trimester: Secondary | ICD-10-CM | POA: Insufficient documentation

## 2020-11-26 DIAGNOSIS — O9902 Anemia complicating childbirth: Secondary | ICD-10-CM | POA: Diagnosis not present

## 2020-11-26 DIAGNOSIS — O26893 Other specified pregnancy related conditions, third trimester: Secondary | ICD-10-CM | POA: Diagnosis not present

## 2020-11-26 DIAGNOSIS — D509 Iron deficiency anemia, unspecified: Secondary | ICD-10-CM | POA: Diagnosis not present

## 2020-11-26 LAB — RESP PANEL BY RT-PCR (FLU A&B, COVID) ARPGX2
Influenza A by PCR: NEGATIVE
Influenza B by PCR: NEGATIVE
SARS Coronavirus 2 by RT PCR: NEGATIVE

## 2020-11-26 NOTE — Discharge Instructions (Signed)

## 2020-11-26 NOTE — MAU Note (Addendum)
Presents stating she was instructed to come in by D. Renae Fickle, CNM "to get things started".  Denies ctxs or pain, just pelvic pressure.  Denies VB or LOF.  Endorses +FM.

## 2020-11-26 NOTE — MAU Provider Note (Signed)
  Subjective : [name] is a 30 y.o. [id number] at [redacted]w[redacted]d  who presents to MAU today for labor check. She was seen in the office yesterday, and SVE was 71mc/80%/-1. She denies contractions vaginal bleeding, and leaking of fluid. She endorses pelvic pressure and reports good fetal movement. Desires for labor to progress spontaneously.     Objective: BP 114/74   Pulse 86   Temp 97.9 F (36.6 C) (Oral)   Resp 18   Ht 5\' 3"  (1.6 m)   Wt 69 kg   SpO2 99%   BMI 26.96 kg/m    GENERAL: Well-developed, well-nourished female in no acute distress.  HEAD: Normocephalic, atraumatic.  CHEST: Normal effort of breathing, regular heart rate ABDOMEN: Soft, nontender, gravid  Cervical exam  4cm Effacement: 80% Station: -1  D. , CNM      Fetal Monitoring: Continuous  Baseline: 120bpm Variability: Moderate  Accelerations: 15x15 present Decelerations: absent Contractions: occasional  Category 1 FHT.  Assessment : Alison Turner at [redacted]w[redacted]d   Currently not laboring.  - Minimal cervical change since exam yesterday with occasional contractions today.   Plan: Pt desires for labor to progress spontaneously, and desires to go home.  -Discussed option of eIOL for 5/26 if labor does not progress through the night. Pt has family donor blood banked with [facility] that expires 5/29. Discussed possibility of IOL being pushed back d/t L&D availability. Pt and husband understand and agreeable to plan.   - L&D notified of eIOL plans for 5/26.  -Pt discharged from MAU.   Marshal Schrecengost 6/26) Danella Deis, BSN, RNC-OB  Student Nurse-Midwife   11/26/2020  1:21 PM

## 2020-11-27 ENCOUNTER — Inpatient Hospital Stay (HOSPITAL_COMMUNITY): Payer: BC Managed Care – PPO

## 2020-11-27 ENCOUNTER — Encounter (HOSPITAL_COMMUNITY): Payer: Self-pay | Admitting: Obstetrics and Gynecology

## 2020-11-27 ENCOUNTER — Inpatient Hospital Stay (HOSPITAL_COMMUNITY)
Admission: AD | Admit: 2020-11-27 | Discharge: 2020-11-29 | DRG: 807 | Disposition: A | Discharge status: Home / Self Care | Payer: BC Managed Care – PPO | Attending: Obstetrics and Gynecology | Admitting: Obstetrics and Gynecology

## 2020-11-27 ENCOUNTER — Other Ambulatory Visit: Payer: Self-pay

## 2020-11-27 DIAGNOSIS — Z3490 Encounter for supervision of normal pregnancy, unspecified, unspecified trimester: Secondary | ICD-10-CM | POA: Diagnosis present

## 2020-11-27 DIAGNOSIS — D509 Iron deficiency anemia, unspecified: Secondary | ICD-10-CM | POA: Diagnosis present

## 2020-11-27 DIAGNOSIS — O9902 Anemia complicating childbirth: Secondary | ICD-10-CM | POA: Diagnosis present

## 2020-11-27 DIAGNOSIS — Z20822 Contact with and (suspected) exposure to covid-19: Secondary | ICD-10-CM | POA: Diagnosis present

## 2020-11-27 DIAGNOSIS — Z3A39 39 weeks gestation of pregnancy: Secondary | ICD-10-CM

## 2020-11-27 DIAGNOSIS — O26893 Other specified pregnancy related conditions, third trimester: Secondary | ICD-10-CM | POA: Diagnosis present

## 2020-11-27 LAB — CBC
HCT: 29.7 % — ABNORMAL LOW (ref 36.0–46.0)
Hemoglobin: 9.8 g/dL — ABNORMAL LOW (ref 12.0–15.0)
MCH: 30.6 pg (ref 26.0–34.0)
MCHC: 33 g/dL (ref 30.0–36.0)
MCV: 92.8 fL (ref 80.0–100.0)
Platelets: 183 10*3/uL (ref 150–400)
RBC: 3.2 MIL/uL — ABNORMAL LOW (ref 3.87–5.11)
RDW: 13.2 % (ref 11.5–15.5)
WBC: 7.3 10*3/uL (ref 4.0–10.5)
nRBC: 0 % (ref 0.0–0.2)

## 2020-11-27 LAB — TYPE AND SCREEN
ABO/RH(D): A POS
Antibody Screen: NEGATIVE

## 2020-11-27 MED ORDER — OXYCODONE-ACETAMINOPHEN 5-325 MG PO TABS: 2.0000 | ORAL_TABLET | ORAL | Status: DC | PRN

## 2020-11-27 MED ORDER — FLEET ENEMA 7-19 GM/118ML RE ENEM: 1.0000 | ENEMA | RECTAL | Status: DC | PRN

## 2020-11-27 MED ORDER — LACTATED RINGERS IV SOLN: INTRAVENOUS | Status: DC

## 2020-11-27 MED ORDER — ACETAMINOPHEN 325 MG PO TABS
650.0000 mg | ORAL_TABLET | ORAL | Status: DC | PRN
  Administered 2020-11-27: 650 mg via ORAL
  Filled 2020-11-27: qty 2

## 2020-11-27 MED ORDER — OXYTOCIN-SODIUM CHLORIDE 30-0.9 UT/500ML-% IV SOLN
2.5000 [IU]/h | INTRAVENOUS | Status: DC
  Administered 2020-11-28 (×2): 2.5 [IU]/h via INTRAVENOUS
  Filled 2020-11-27 (×2): qty 500

## 2020-11-27 MED ORDER — ONDANSETRON HCL 4 MG/2ML IJ SOLN
4.0000 mg | Freq: Four times a day (QID) | INTRAMUSCULAR | Status: DC | PRN
  Administered 2020-11-28: 4 mg via INTRAVENOUS
  Filled 2020-11-27: qty 2

## 2020-11-27 MED ORDER — OXYTOCIN BOLUS FROM INFUSION
333.0000 mL | Freq: Once | INTRAVENOUS | Status: AC
  Administered 2020-11-28: 333 mL via INTRAVENOUS

## 2020-11-27 MED ORDER — TERBUTALINE SULFATE 1 MG/ML IJ SOLN: 0.2500 mg | Freq: Once | INTRAMUSCULAR | Status: DC | PRN

## 2020-11-27 MED ORDER — SOD CITRATE-CITRIC ACID 500-334 MG/5ML PO SOLN
30.0000 mL | ORAL | Status: DC | PRN
  Administered 2020-11-27: 30 mL via ORAL
  Filled 2020-11-27 (×2): qty 15

## 2020-11-27 MED ORDER — OXYCODONE-ACETAMINOPHEN 5-325 MG PO TABS
1.0000 | ORAL_TABLET | ORAL | Status: DC | PRN
Start: 2020-11-27 — End: 2020-11-28

## 2020-11-27 MED ORDER — OXYTOCIN-SODIUM CHLORIDE 30-0.9 UT/500ML-% IV SOLN
1.0000 m[IU]/min | INTRAVENOUS | Status: DC
  Administered 2020-11-27: 2 m[IU]/min via INTRAVENOUS

## 2020-11-27 MED ORDER — LACTATED RINGERS IV SOLN
500.0000 mL | INTRAVENOUS | Status: DC | PRN
Start: 2020-11-27 — End: 2020-11-28

## 2020-11-27 NOTE — H&P (Signed)
OB ADMISSION/ HISTORY & PHYSICAL:  Admission Date: 11/27/2020  2:10 PM  Admit Diagnosis: Pregnancy [Z34.90]    Alison Turner is a 30 y.o. female presenting for scheduled IOL at term d/t advanced dilation..  Prenatal History: [id number]   EDC : 12/01/2020, Alternate EDD Entry  Prenatal care at [facility] since 11 wks   Prenatal course complicated by: 1. Hx PTL, + FfN and BMZ x 2 at 30 wks Hx PTB 36.6 wks 2. Hx SGA at 28 wks19%, improved to 26% at 36 wks 3. Anemia, hgb 10, has donor blood on standby in blood bank if needed 4. Hx Asherman's, s/p IUI  Prenatal Labs: ABO, Rh:   A pos Antibody:  neg Rubella:   imm RPR:   NR HBsAg:   neg HIV:   neg GBS:   neg 1 hr Glucola : 112 Genetic Screening: Panorama LR XX, AFP 1 neg Ultrasound: normal anatomy, isolated EIF, anterior placenta, AGA 31%     Maternal Diabetes: No Genetic Screening: Normal Maternal Ultrasounds/Referrals: Normal Fetal Ultrasounds or other Referrals:  None Maternal Substance Abuse:  No Significant Maternal Medications:  BMZ x 2 at 30 wks Significant Maternal Lab Results:  Group B Strep negative Other Comments:  None  Medical / Surgical History :  Past medical history:  Past Medical History:  Diagnosis Date  . Anemia   . 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 Ectopic pregnancy    received MTX  . Headache   . Hypothyroidism    has resolved  . Infection   . Kidney calculi   . Kidney stones   . Ovarian cyst   . Traumatic injury during pregnancy in third trimester 06/19/2015     Past surgical history:  Past Surgical History:  Procedure Laterality Date  . MOUTH SURGERY       Family History:  Family History  Problem Relation Age of Onset  . Hypertension Mother   . Hyperthyroidism Mother   . Kidney disease Father   . Hyperlipidemia Father   . Cancer Maternal Grandmother   . Diabetes Maternal Grandmother   . Rheum arthritis Maternal Grandmother    . Kidney disease Maternal Grandfather   . Heart disease Maternal Grandfather   . Stroke Maternal Grandfather   . Rheum arthritis Maternal Grandfather   . Hypertension Paternal Grandmother   . Heart disease Paternal Grandmother   . Stroke Paternal Grandmother   . Rheum arthritis Paternal Grandmother   . Kidney disease Paternal Grandfather   . Heart disease Paternal Grandfather   . Hypertension Paternal Grandfather   . Rheum arthritis Paternal Grandfather   . Healthy Sister   . Healthy Brother   . Healthy Sister   . Healthy Sister   . Healthy Sister   . Healthy Brother   . Healthy Brother      Social History:  reports that she has never smoked. She has never used smokeless tobacco. She reports previous alcohol use. She reports that she does not use drugs.   Allergies: Amoxicillin and Lidocaine   Current Medications at time of admission:  Medications Prior to Admission  Medication Sig Dispense Refill Last Dose  . Prenatal Vit-Fe Fumarate-FA (PRENATAL MULTIVITAMIN) TABS tablet Take 1 tablet by mouth daily at 12 noon.        Review of Systems: ROS  Physical Exam: Vital signs and nursing notes reviewed.  Patient Vitals for the past 24 hrs:  Height Weight  11/27/20 1412 5'  3" (1.6 m) 68.5 kg     General: AAO x 3, NAD Heart: RRR Lungs:CTAB Abdomen: Gravid, NT, Leopold's vertex, fetal spine to maternal R Extremities: no edema Genitalia / VE: Dilation: 4.5 Effacement (%): 90 Station: 0 Presentation: Vertex Exam by:: Arlan Organ, CNM   FHR: 120 BPM, mod variability, + accels, no decels TOCO: Ctx q 4-5 min, mild  Labs:   Pending T&S, CBC, RPR  No results for input(s): WBC, HGB, HCT, PLT in the last 72 hours.   Assessment:  30 y.o. [id number] at [redacted]w[redacted]d For IOL 1. Prodromal stage of labor 2. FHR category 1 3. GBS neg 4. Desires unmedicated/waterbirth 5. Breastfeeding 6. Placenta disposal per patient request 7. Anemia, methergine allergy, has donor blood in  blood bank PRN   Plan:  1. Admit to BS, Pitocin augmentation until active labor 2. Routine L&D orders 3. Analgesia/anesthesia PRN  4. May labor in water once physiologic labor established 5. Anticipate NSVB 6. Active mgmt of 3rd stage  Dr Ernestina Penna notified of admission / plan of care   Neta Mends CNM, MSN 11/27/2020, 2:57 PM

## 2020-11-27 NOTE — Progress Notes (Signed)
Devany Aja is a 30 y.o. [phone] at [redacted]w[redacted]d admitted for term IOL  Subjective:  Still comfortable on Pitocin, has been alternating positions, using BB at [facility] and resting in bed. Some pressure with ctx but not painful.   Objective: Vitals:   11/27/20 1615 11/27/20 1710 11/27/20 2006 11/27/20 2107  BP: 115/74 107/71 117/69 122/72  Pulse: 77 (!) 103 73 92  Resp:    16  Weight:      Height:         FHT:  FHR: 120 bpm, variability: moderate,  accelerations:  Present,  decelerations:  Absent UC:   regular, every 2 minutes SVE:   Dilation: 5 Effacement (%): 90 Station: Plus 1 Exam by:: Arlan Organ, CNM BBOW  Pitocin 10 mu/min  Labs:   Recent Labs    11/27/20 1450  WBC 7.3  HGB 9.8*  HCT 29.7*  PLT 183    Assessment / Plan: [id number] 30 y.o. [redacted]w[redacted]d Protracted latent phase  Labor: Minimal effect from Pitocin. Discussed option for AROM and will consider after taking Pit break for dinner.  Continue Pitocin increase for now. Water immersion once active labor ensues.  Preeclampsia:  no s/sx Fetal Wellbeing:  Category I Pain Control:  Labor support without medications I/D:  GBS neg Anticipated MOD:  NSVD  Neta Mends, CNM, MSN 11/27/2020, 9:14 PM

## 2020-11-28 ENCOUNTER — Encounter (HOSPITAL_COMMUNITY): Payer: Self-pay | Admitting: Obstetrics and Gynecology

## 2020-11-28 LAB — CBC
HCT: 27.3 % — ABNORMAL LOW (ref 36.0–46.0)
Hemoglobin: 9.1 g/dL — ABNORMAL LOW (ref 12.0–15.0)
MCH: 30.5 pg (ref 26.0–34.0)
MCHC: 33.3 g/dL (ref 30.0–36.0)
MCV: 91.6 fL (ref 80.0–100.0)
Platelets: 161 10*3/uL (ref 150–400)
RBC: 2.98 MIL/uL — ABNORMAL LOW (ref 3.87–5.11)
RDW: 13.1 % (ref 11.5–15.5)
WBC: 10.4 10*3/uL (ref 4.0–10.5)
nRBC: 0 % (ref 0.0–0.2)

## 2020-11-28 LAB — RPR: RPR Ser Ql: NONREACTIVE

## 2020-11-28 MED ORDER — DIPHENHYDRAMINE HCL 25 MG PO CAPS: 25.0000 mg | ORAL_CAPSULE | Freq: Four times a day (QID) | ORAL | Status: DC | PRN

## 2020-11-28 MED ORDER — BISACODYL 10 MG RE SUPP: 10.0000 mg | Freq: Every day | RECTAL | Status: DC | PRN

## 2020-11-28 MED ORDER — TRANEXAMIC ACID-NACL 1000-0.7 MG/100ML-% IV SOLN
INTRAVENOUS | Status: AC
  Filled 2020-11-28: qty 100

## 2020-11-28 MED ORDER — FLEET ENEMA 7-19 GM/118ML RE ENEM: 1.0000 | ENEMA | Freq: Every day | RECTAL | Status: DC | PRN

## 2020-11-28 MED ORDER — COCONUT OIL OIL: 1.0000 "application " | TOPICAL_OIL | Status: DC | PRN

## 2020-11-28 MED ORDER — IBUPROFEN 600 MG PO TABS
600.0000 mg | ORAL_TABLET | Freq: Four times a day (QID) | ORAL | Status: DC
  Administered 2020-11-28 – 2020-11-29 (×5): 600 mg via ORAL
  Filled 2020-11-28 (×6): qty 1

## 2020-11-28 MED ORDER — WITCH HAZEL-GLYCERIN EX PADS: 1.0000 "application " | MEDICATED_PAD | CUTANEOUS | Status: DC | PRN

## 2020-11-28 MED ORDER — SENNOSIDES-DOCUSATE SODIUM 8.6-50 MG PO TABS
2.0000 | ORAL_TABLET | ORAL | Status: DC
  Administered 2020-11-28: 2 via ORAL
  Filled 2020-11-28 (×2): qty 2

## 2020-11-28 MED ORDER — ACETAMINOPHEN 500 MG PO TABS
1000.0000 mg | ORAL_TABLET | Freq: Four times a day (QID) | ORAL | Status: DC
  Administered 2020-11-28 – 2020-11-29 (×6): 1000 mg via ORAL
  Filled 2020-11-28 (×6): qty 2

## 2020-11-28 MED ORDER — DIBUCAINE (PERIANAL) 1 % EX OINT: 1.0000 "application " | TOPICAL_OINTMENT | CUTANEOUS | Status: DC | PRN

## 2020-11-28 MED ORDER — BENZOCAINE-MENTHOL 20-0.5 % EX AERO: 1.0000 "application " | INHALATION_SPRAY | CUTANEOUS | Status: DC | PRN

## 2020-11-28 MED ORDER — SIMETHICONE 80 MG PO CHEW: 80.0000 mg | CHEWABLE_TABLET | ORAL | Status: DC | PRN

## 2020-11-28 MED ORDER — TRANEXAMIC ACID-NACL 1000-0.7 MG/100ML-% IV SOLN
1000.0000 mg | INTRAVENOUS | Status: AC
  Administered 2020-11-28: 1000 mg via INTRAVENOUS

## 2020-11-28 MED ORDER — PRENATAL MULTIVITAMIN CH
1.0000 | ORAL_TABLET | Freq: Every day | ORAL | Status: DC
  Administered 2020-11-28 – 2020-11-29 (×2): 1 via ORAL
  Filled 2020-11-28 (×2): qty 1

## 2020-11-28 MED ORDER — ZOLPIDEM TARTRATE 5 MG PO TABS: 5.0000 mg | ORAL_TABLET | Freq: Every evening | ORAL | Status: DC | PRN

## 2020-11-28 MED ORDER — ONDANSETRON HCL 4 MG PO TABS
4.0000 mg | ORAL_TABLET | ORAL | Status: DC | PRN
  Filled 2020-11-28: qty 1

## 2020-11-28 MED ORDER — ONDANSETRON HCL 4 MG/2ML IJ SOLN: 4.0000 mg | INTRAMUSCULAR | Status: DC | PRN

## 2020-11-28 MED ORDER — TETANUS-DIPHTH-ACELL PERTUSSIS 5-2.5-18.5 LF-MCG/0.5 IM SUSY: 0.5000 mL | PREFILLED_SYRINGE | Freq: Once | INTRAMUSCULAR | Status: DC

## 2020-11-28 NOTE — Lactation Note (Signed)
This note was copied from a baby's chart. Lactation Consultation Note  Patient Name: Alison Turner Today's Date: 11/28/2020   Age:30 hours Mom pumping on arrival.  Moms choice.  Mom reports they are breastfeeding well.  Mom plans to milk share with some family/ friends. Mom denies need for lactation services at this time.  priased breastfeeding.  Urged to call lactation as needed.  Maternal Data    Feeding    LATCH Score                    Lactation Tools Discussed/Used    Interventions    Discharge    Consult Status      [name] [name] 11/28/2020, 9:15 PM

## 2020-11-28 NOTE — Lactation Note (Signed)
This note was copied from a baby's chart. Lactation Consultation Note  Patient Name: Girl Darcy Barbara ELFYB'O Date: 11/28/2020 Reason for consult: L&D Initial assessment;Term Age:30 hours P3, term female infant. LC entered room, infant had finished breastfeeding L&D (RN) assisted with latch and infant BF for 15 minutes.  Per mom, she would like hand pump once she is on MBU. Mom knows to breast feed infant according to primal cues: licking, tasting, smacking, hands and fist in mouth and rooting, and BF infant STS. LC discussed infant's input and output with parents. Mom knows to call RN or LC on MBU if she needs assistance with latching infant at the breast.  Maternal Data Has patient been taught Hand Expression?: Yes Does the patient have breastfeeding experience prior to this delivery?: Yes How long did the patient breastfeed?: Per mom, she BF her first two children for 2 years each, past hx of having oversupply and has done milk sharing with family due large volumes of milk she produces.  Feeding Mother's Current Feeding Choice: Breast Milk  LATCH Score                    Lactation Tools Discussed/Used    Interventions Interventions: Breast feeding basics reviewed;Skin to skin;Hand express;Education  Discharge Pump: Personal WIC Program: No  Consult Status Consult Status: Follow-up Date: 11/28/20 Follow-up type: In-patient    Danelle Earthly 11/28/2020, 2:16 AM

## 2020-11-29 MED ORDER — FERROUS SULFATE 300 (60 FE) MG/5ML PO SYRP: 300.0000 mg | ORAL_SOLUTION | Freq: Every day | ORAL | 3 refills | Status: AC

## 2020-11-29 MED ORDER — IBUPROFEN 600 MG PO TABS: 600.0000 mg | ORAL_TABLET | Freq: Four times a day (QID) | ORAL | 0 refills | Status: DC

## 2020-11-29 MED ORDER — ACETAMINOPHEN 500 MG PO TABS: 1000.0000 mg | ORAL_TABLET | Freq: Four times a day (QID) | ORAL | 0 refills | Status: DC

## 2020-11-29 NOTE — Lactation Note (Signed)
This note was copied from a baby's chart. Lactation Consultation Note  Patient Name: Girl Ajahnae Rathgeber IBBCW'U Date: 11/29/2020 Reason for consult: Follow-up assessment;Infant weight loss;Term Age:30 hours  Visited with mom of 66 hours old FT female, she's a P3 and experienced BF. She also voiced she has a Hx of oversupply and plans to donate her breastmilk to friends and family who needs them. Mom and baby are going home today, baby is at 4% weight loss.  Reviewed discharge education, breastmilk storage guidelines, lactogenesis II and tips to prevent engorgement. Mom reported BF is going well and that she didn't need any assistance at this point. She asked for extra breastmilk storage containers, LC brought some and also ice packs for her to take home.   FOB present and supportive. Parents reported all questions and concerns were answered, they're both aware of LC OP services and will contact PRN.   Maternal Data    Feeding Mother's Current Feeding Choice: Breast Milk  Lactation Tools Discussed/Used Tools: Pump Breast pump type: Double-Electric Breast Pump Pump Education: Setup, frequency, and cleaning;Milk Storage Reason for Pumping: engorgement prevention (she has Hx of oversupply) Pumping frequency: as needed  Interventions Interventions: Breast feeding basics reviewed;DEBP;Education  Discharge Discharge Education: Engorgement and breast care;Warning signs for feeding baby;Outpatient recommendation Pump: DEBP  Consult Status Consult Status: Complete Date: 11/29/20 Follow-up type: Call as needed    Jenifer Struve Venetia Constable 11/29/2020, 11:25 AM

## 2020-11-29 NOTE — Progress Notes (Signed)
Post Partum Day 1, SVD, girl breast feeding  Subjective: no complaints, up ad lib, voiding, tolerating PO and + flatus Slight dizzy this morning   Objective: Blood pressure 104/76, pulse 67, temperature 97.6 F (36.4 C), temperature source Oral, resp. rate 16, height 5\' 3"  (1.6 m), weight 68.5 kg, SpO2 99 %, unknown if currently breastfeeding.  Physical Exam:  General: alert and cooperative Lochia: appropriate Uterine Fundus: firm Incision: NA DVT Evaluation: No evidence of DVT seen on physical exam.  Recent Labs    11/27/20 1450 11/28/20 0546  HGB 9.8* 9.1*  HCT 29.7* 27.3*    Assessment/Plan: Discharge home if dizziness resolves. PP care/ warning s/s, after-hr contact info d/w pt F/up CNM Paul in office in 6 weeks  Iron suppl (pt take liquid iron at home) and PNvit.   LOS: 2 days   11/30/20 11/29/2020, 12:23 PM

## 2020-11-29 NOTE — Discharge Summary (Signed)
Postpartum Discharge Summary  Patient Name: Alison Turner DOB: September 07, 1990 MRN: [id number]  Date of admission: 11/27/2020 Delivery date:11/28/2020  Delivering provider: Neta Mends  Date of discharge: 11/29/2020  Admitting diagnosis: Pregnancy [Z34.90] [redacted]w[redacted]d   Advanced cervical dilatation  Secondary diagnosis:  Principal Problem:   Postpartum care following vaginal delivery 5/27 Active Problems:   SVD 5/27   Maternal anemia, with delivery - IDA   Encounter for planned induction of labor    Discharge diagnosis: Term Pregnancy Delivered                                              Post partum procedures:NA Augmentation: AROM Complications: None  Hospital course: Induction of Labor With Vaginal Delivery   30 y.o. yo [phone] at [redacted]w[redacted]d was admitted to the hospital 11/27/2020 for induction of labor.  Indication for induction: Favorable cervix at term.  Patient had an uncomplicated labor course as follows: Membrane Rupture Time/Date: 11:05 PM ,11/27/2020   Delivery Method:Vaginal, Spontaneous  Episiotomy: None  Lacerations:  None  Details of delivery can be found in separate delivery note.  Patient had a routine postpartum course. Patient is discharged home 11/29/20.  Newborn Data: Birth date:11/28/2020  Birth time:12:45 AM  Gender:Female  Living status:Living  Apgars:9 ,9  Weight:3124 g   Vitals:   11/28/20 1254 11/28/20 1333 11/28/20 2104 11/29/20 0555  BP: 110/82 108/80 111/72 104/76  Pulse: 74 77 69 67  Resp: 16 18 17 16   Temp: 98.2 F (36.8 C) 98.3 F (36.8 C) 98 F (36.7 C) 97.6 F (36.4 C)  TempSrc: Oral Oral Oral Oral  SpO2: 100%  99%   Weight:      Height:       General: alert and cooperative Lochia: appropriate Uterine Fundus: firm Incision: N/A DVT Evaluation: No evidence of DVT seen on physical exam. Labs: Lab Results  Component Value Date   WBC 10.4 11/28/2020   HGB 9.1 (L) 11/28/2020   HCT 27.3 (L) 11/28/2020   MCV 91.6 11/28/2020   PLT  161 11/28/2020   CMP Latest Ref Rng & Units 03/06/2020  Glucose 70 - 99 mg/dL 05/06/2020)  BUN 6 - 20 mg/dL 12  Creatinine 973(Z - 3.29 mg/dL 9.24  Sodium 2.68 - 341 mmol/L 138  Potassium 3.5 - 5.1 mmol/L 3.7  Chloride 98 - 111 mmol/L 103  CO2 22 - 32 mmol/L 26  Calcium 8.9 - 10.3 mg/dL 9.6  Total Protein 6.5 - 8.1 g/dL 7.3  Total Bilirubin 0.3 - 1.2 mg/dL 0.9  Alkaline Phos 38 - 126 U/L 55  AST 15 - 41 U/L 17  ALT 0 - 44 U/L 13   Edinburgh Score: Edinburgh Postnatal Depression Scale Screening Tool 11/28/2020  I have been able to laugh and see the funny side of things. 0  I have looked forward with enjoyment to things. 0  I have blamed myself unnecessarily when things went wrong. 0  I have been anxious or worried for no good reason. 1  I have felt scared or panicky for no good reason. 0  Things have been getting on top of me. 0  I have been so unhappy that I have had difficulty sleeping. 0  I have felt sad or miserable. 0  I have been so unhappy that I have been crying. 0  The thought of harming myself  has occurred to me. 0  Edinburgh Postnatal Depression Scale Total 1     After visit meds:  Allergies as of 11/29/2020      Reactions   Lidocaine Itching, Nausea Only, Other (See Comments)      Amoxicillin Rash      Medication List    TAKE these medications   acetaminophen 500 MG tablet Commonly known as: TYLENOL Take 2 tablets (1,000 mg total) by mouth every 6 (six) hours.   calcium carbonate 1250 (500 Ca) MG chewable tablet Commonly known as: OS-CAL Chew 1 tablet by mouth daily.   cetirizine 5 MG tablet Commonly known as: ZYRTEC Take 5 mg by mouth daily.   ferrous sulfate 300 (60 Fe) MG/5ML syrup Take 5 mLs (300 mg total) by mouth daily.   ibuprofen 600 MG tablet Commonly known as: ADVIL Take 1 tablet (600 mg total) by mouth every 6 (six) hours.   prenatal multivitamin Tabs tablet Take 1 tablet by mouth daily at 12 noon.        Discharge home in stable  condition Infant Feeding: Breast Infant Disposition:home with mother Discharge instruction: per After Visit Summary and Postpartum booklet. Activity: Advance as tolerated. Pelvic rest for 6 weeks.  Diet: routine diet Future Appointments:No future appointments. Follow up Visit:  Follow-up Information    Neta Mends, CNM Follow up in 6 week(s).   Specialty: Obstetrics and Gynecology Contact information: [address] [place] [address] [phone]                Please schedule this patient for a In person postpartum visit in 6 weeks with the following provider- CNM Renae Fickle  11/29/2020 Robley Fries, MD

## 2020-12-01 ENCOUNTER — Inpatient Hospital Stay (HOSPITAL_COMMUNITY): Admit: 2020-12-01 | Payer: Self-pay

## 2020-12-23 IMAGING — US US BREAST*R* LIMITED INC AXILLA
1 series · 5 of 5 positions shown · non-contrast
Comparison: None.

CLINICAL DATA: 29-year-old female presenting with a new lump in the
periareolar right breast. Patient is currently breast feeding and
also newly pregnant.

EXAM:
ULTRASOUND OF THE RIGHT BREAST

[Series 1: us breast*right* limited inc axilla · 0.06mm/px · 5 of 5 slices shown]
[im 1/5]
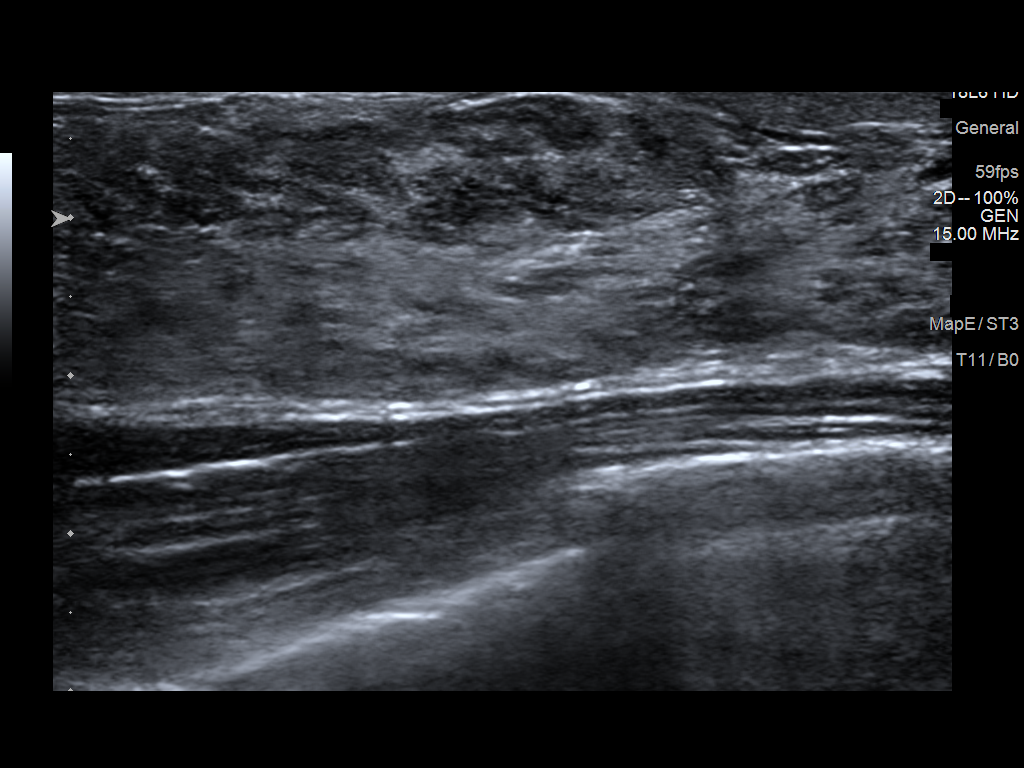
[im 2/5]
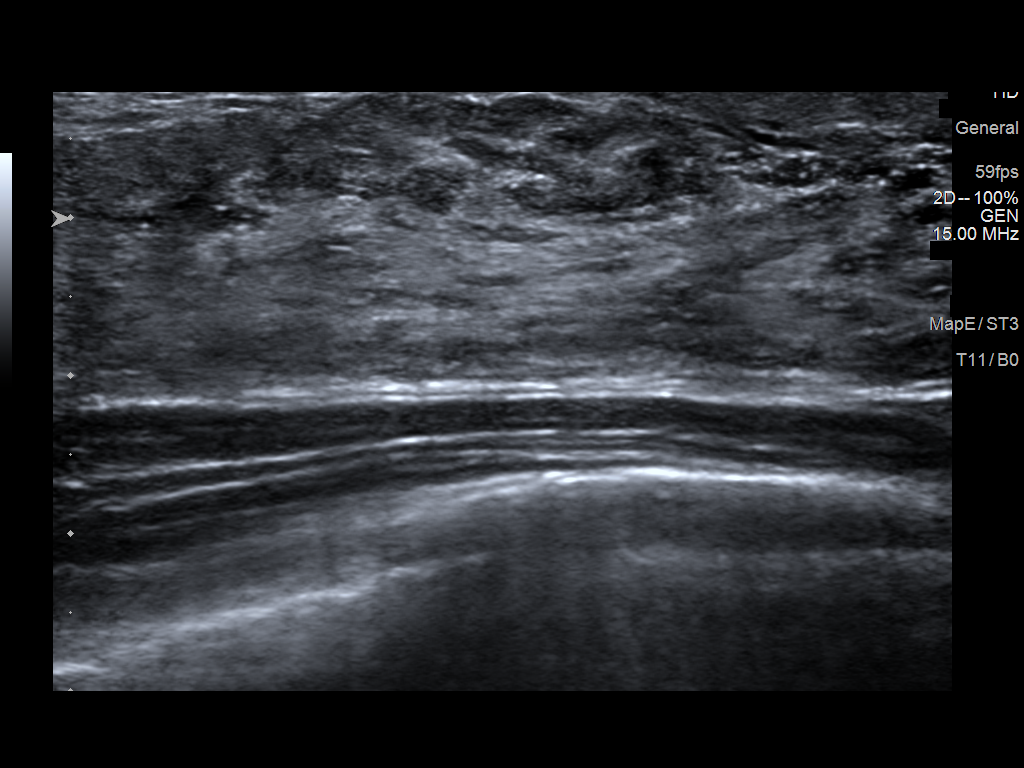
[im 3/5]
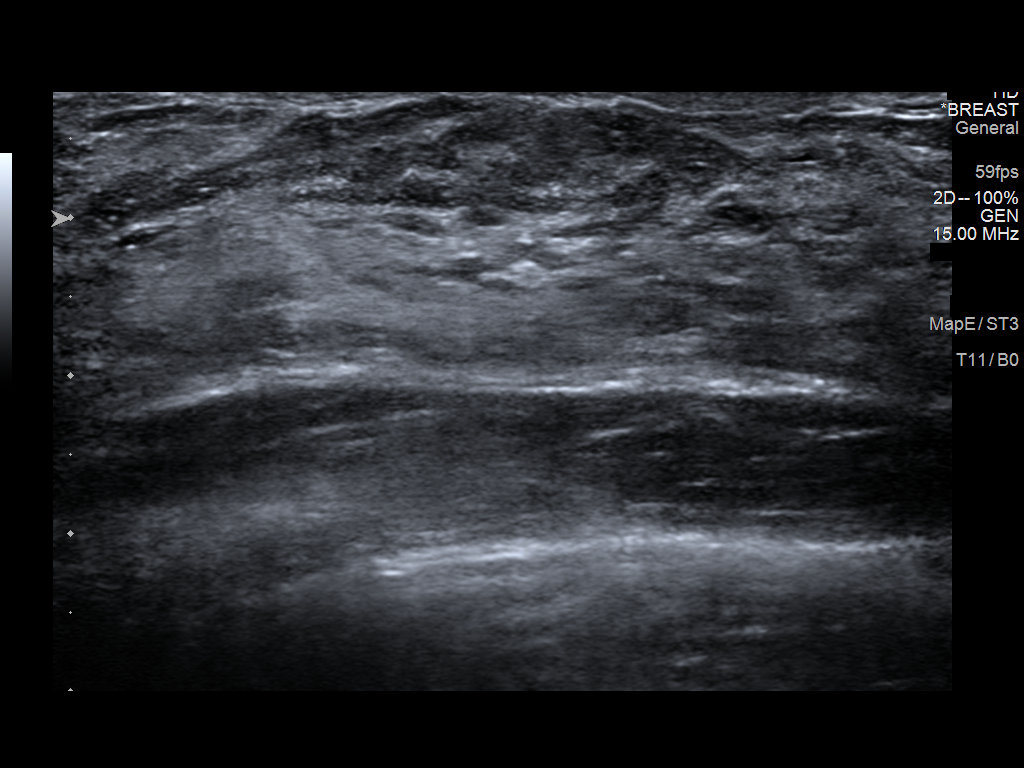
[im 4/5]
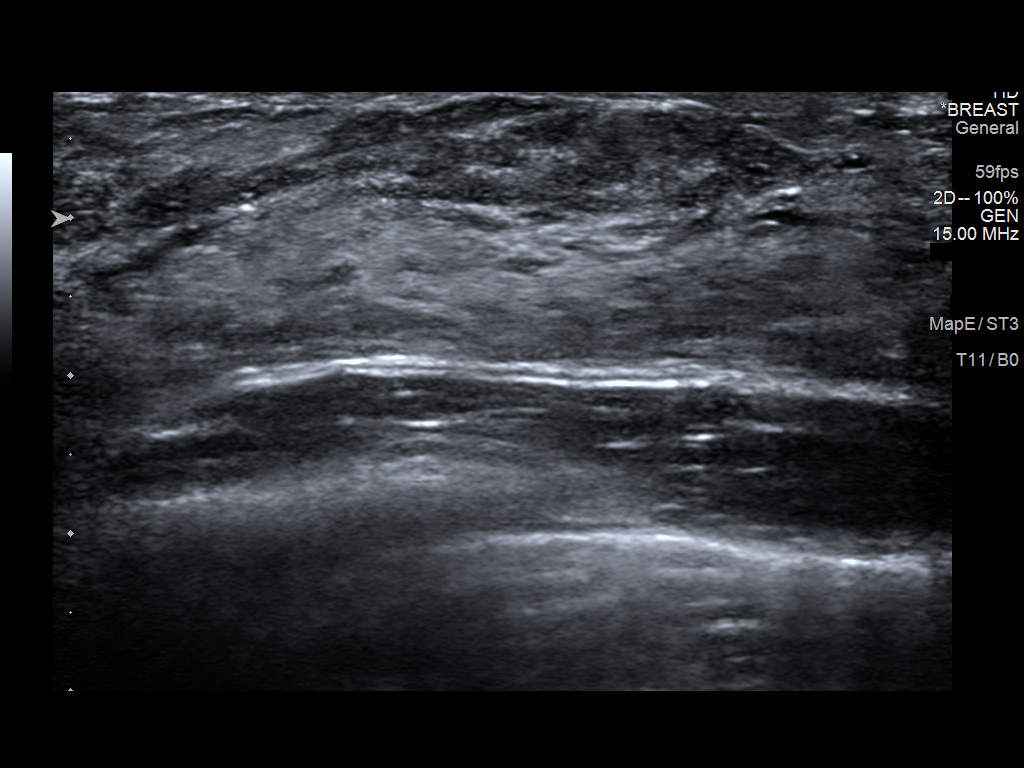
[im 5/5]
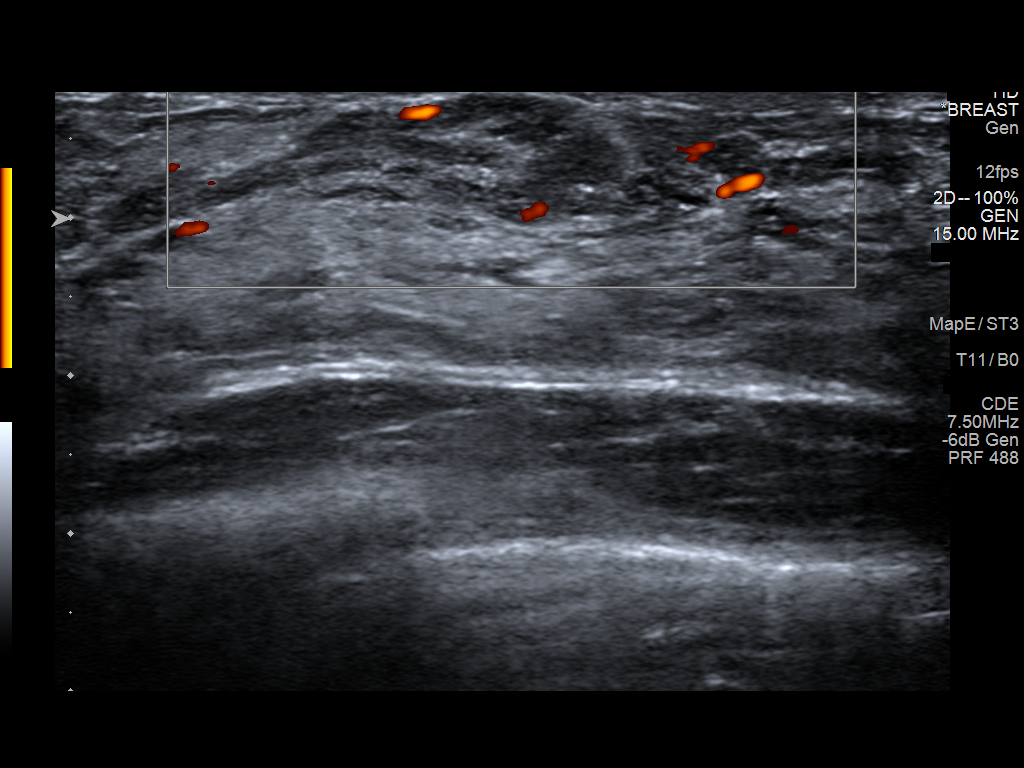

[5 of 5 positions shown; findings below may reference images not displayed]

FINDINGS: On physical exam, I palpate a ridge of tissue at the site of concern
reported by the patient in the periareolar superior right breast. No
fixed discrete mass.

Targeted ultrasound is performed in the right breast at 12 o'clock 1
cm from the nipple at the palpable site of concern reported by the
patient demonstrating an island of dense tissue but no suspicious
cystic or solid mass.
IMPRESSION: At the palpable site of concern in the right breast there is a dense
island of tissue but no sonographic evidence of malignancy or other
abnormal finding.

RECOMMENDATION:
1.  Clinical follow-up as needed for the right breast.

2.  Begin routine annual screening mammography at age 40.

I have discussed the findings and recommendations with the patient.
If applicable, a reminder letter will be sent to the patient
regarding the next appointment.

BI-RADS CATEGORY  1: Negative.

## 2021-04-22 DIAGNOSIS — R5383 Other fatigue: Secondary | ICD-10-CM | POA: Diagnosis not present

## 2021-04-22 DIAGNOSIS — Z01419 Encounter for gynecological examination (general) (routine) without abnormal findings: Secondary | ICD-10-CM | POA: Diagnosis not present

## 2021-04-22 DIAGNOSIS — Z681 Body mass index (BMI) 19 or less, adult: Secondary | ICD-10-CM | POA: Diagnosis not present

## 2021-04-22 DIAGNOSIS — Z124 Encounter for screening for malignant neoplasm of cervix: Secondary | ICD-10-CM | POA: Diagnosis not present

## 2021-05-04 DIAGNOSIS — N6320 Unspecified lump in the left breast, unspecified quadrant: Secondary | ICD-10-CM | POA: Diagnosis not present

## 2021-05-04 DIAGNOSIS — R922 Inconclusive mammogram: Secondary | ICD-10-CM | POA: Diagnosis not present

## 2021-06-01 ENCOUNTER — Emergency Department (HOSPITAL_BASED_OUTPATIENT_CLINIC_OR_DEPARTMENT_OTHER)
Admission: EM | Admit: 2021-06-01 | Discharge: 2021-06-01 | Disposition: A | Discharge status: Home / Self Care | Payer: BC Managed Care – PPO | Attending: Emergency Medicine | Admitting: Emergency Medicine

## 2021-06-01 ENCOUNTER — Encounter (HOSPITAL_BASED_OUTPATIENT_CLINIC_OR_DEPARTMENT_OTHER): Payer: Self-pay

## 2021-06-01 ENCOUNTER — Other Ambulatory Visit: Payer: Self-pay

## 2021-06-01 DIAGNOSIS — N939 Abnormal uterine and vaginal bleeding, unspecified: Secondary | ICD-10-CM | POA: Diagnosis not present

## 2021-06-01 DIAGNOSIS — N9489 Other specified conditions associated with female genital organs and menstrual cycle: Secondary | ICD-10-CM | POA: Diagnosis not present

## 2021-06-01 DIAGNOSIS — U071 COVID-19: Secondary | ICD-10-CM | POA: Diagnosis not present

## 2021-06-01 LAB — CBC WITH DIFFERENTIAL/PLATELET
Abs Immature Granulocytes: 0.01 10*3/uL (ref 0.00–0.07)
Basophils Absolute: 0 10*3/uL (ref 0.0–0.1)
Basophils Relative: 0 %
Eosinophils Absolute: 0 10*3/uL (ref 0.0–0.5)
Eosinophils Relative: 0 %
HCT: 35.2 % — ABNORMAL LOW (ref 36.0–46.0)
Hemoglobin: 12 g/dL (ref 12.0–15.0)
Immature Granulocytes: 0 %
Lymphocytes Relative: 17 %
Lymphs Abs: 0.7 10*3/uL (ref 0.7–4.0)
MCH: 30.8 pg (ref 26.0–34.0)
MCHC: 34.1 g/dL (ref 30.0–36.0)
MCV: 90.5 fL (ref 80.0–100.0)
Monocytes Absolute: 0.3 10*3/uL (ref 0.1–1.0)
Monocytes Relative: 8 %
Neutro Abs: 2.9 10*3/uL (ref 1.7–7.7)
Neutrophils Relative %: 75 %
Platelets: 223 10*3/uL (ref 150–400)
RBC: 3.89 MIL/uL (ref 3.87–5.11)
RDW: 12.3 % (ref 11.5–15.5)
WBC: 3.9 10*3/uL — ABNORMAL LOW (ref 4.0–10.5)
nRBC: 0 % (ref 0.0–0.2)

## 2021-06-01 LAB — HCG, QUANTITATIVE, PREGNANCY: hCG, Beta Chain, Quant, S: <1 m[IU]/mL (ref <5)

## 2021-06-01 LAB — RESP PANEL BY RT-PCR (FLU A&B, COVID) ARPGX2
Influenza A by PCR: POSITIVE — AB
Influenza B by PCR: NEGATIVE
SARS Coronavirus 2 by RT PCR: POSITIVE — AB

## 2021-06-01 LAB — BASIC METABOLIC PANEL
Anion gap: 11 (ref 5–15)
BUN: 12 mg/dL (ref 6–20)
CO2: 23 mmol/L (ref 22–32)
Calcium: 9.3 mg/dL (ref 8.9–10.3)
Chloride: 104 mmol/L (ref 98–111)
Creatinine, Ser: 0.67 mg/dL (ref 0.44–1.00)
GFR, Estimated: >60 mL/min (ref >60)
Glucose, Bld: 115 mg/dL — ABNORMAL HIGH (ref 70–99)
Potassium: 3.5 mmol/L (ref 3.5–5.1)
Sodium: 138 mmol/L (ref 135–145)

## 2021-06-01 LAB — URINALYSIS, ROUTINE W REFLEX MICROSCOPIC
Bilirubin Urine: NEGATIVE
Glucose, UA: NEGATIVE mg/dL
Hgb urine dipstick: NEGATIVE
Leukocytes,Ua: NEGATIVE
Nitrite: NEGATIVE
Specific Gravity, Urine: 1.035 — ABNORMAL HIGH (ref 1.005–1.030)
pH: 5.5 (ref 5.0–8.0)

## 2021-06-01 NOTE — ED Provider Notes (Signed)
Patient left prior to labs resulting.  She did test positive for COVID and flu.  She cannot wait for lab work to resolve that she need to get her child.  Overall she appears well.  She states that she will follow-up her tests online including her pregnancy test.  She is not having any abdominal pain.  Ultimately pregnancy test came back negative.  Lab work otherwise reassuring.  Symptoms likely secondary to both COVID and influenza.   Virgina Norfolk, DO 06/01/21 1023

## 2021-06-01 NOTE — ED Provider Notes (Signed)
[facility] EMERGENCY DEPT Provider Note   CSN: [id number] Arrival date & time: 06/01/21  2706     History Chief Complaint  Patient presents with   Multiple Complaints    Alison Turner is a 30 y.o. female.  HPI     This is a 30 year old G7, P61 female who presents with several complaints.  Patient reports that she has had vaginal bleeding for the last 2 weeks.  She is currently breast-feeding her 13-month-old infant but has recently had episodes of menstrual bleeding for 2 to 3 days which have been her normal.  However, she has had bleeding over the last 2 weeks which she describes as light.  During this time she is also developed urinary frequency and urgency as well as temperature up to 102.  She also reports multiple sick exposures.  She has not had any nausea or vomiting.  Patient states she did take a pregnancy test and it was negative at home but "when I had my ectopic I had a negative urine pregnancy test as well."  She is most concerned about this.  She reports nonlateralizing abdominal cramping and nonlateralizing back pain.  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   scarring removed via hysteroscopy   Ectopic pregnancy    received MTX   Headache    Hypothyroidism    has resolved   Infection    Kidney calculi    Kidney stones    Ovarian cyst    Traumatic injury during pregnancy in third trimester 06/19/2015    Patient Active Problem List   Diagnosis Date Noted   Encounter for planned induction of labor 11/27/2020   Maternal anemia, with delivery - IDA 01/14/2018   SVD 5/27 01/13/2018   Dysfunction of left eustachian tube 02/19/2016   Recurrent UTI 01/30/2016   Postpartum care following vaginal delivery 5/27 07/17/2015   Traumatic injury during pregnancy in third trimester 06/19/2015    Past Surgical History:  Procedure Laterality Date   MOUTH SURGERY       OB History     Gravida  7   Para  3    Term  2   Preterm  1   AB  4   Living  3      SAB  3   IAB      Ectopic  1   Multiple  0   Live Births  3           Family History  Problem Relation Age of Onset   Hypertension Mother    Hyperthyroidism Mother    Kidney disease Father    Hyperlipidemia Father    Cancer Maternal Grandmother    Diabetes Maternal Grandmother    Rheum arthritis Maternal Grandmother    Kidney disease Maternal Grandfather    Heart disease Maternal Grandfather    Stroke Maternal Grandfather    Rheum arthritis Maternal Grandfather    Hypertension Paternal Grandmother    Heart disease Paternal Grandmother    Stroke Paternal Grandmother    Rheum arthritis Paternal Grandmother    Kidney disease Paternal Grandfather    Heart disease Paternal Grandfather    Hypertension Paternal Grandfather    Rheum arthritis Paternal Actor    Healthy Sister    Healthy Brother    Healthy Sister    Healthy Sister    Healthy Sister    Healthy Brother    Healthy Brother     Social  History   Tobacco Use   Smoking status: Never   Smokeless tobacco: Never   Tobacco comments:    tried a few puffs, never inhaled  Vaping Use   Vaping Use: Never used  Substance Use Topics   Alcohol use: Not Currently    Comment: Occasionally   Drug use: Never    Home Medications Prior to Admission medications   Medication Sig Start Date End Date Taking? Authorizing Provider  acetaminophen (TYLENOL) 500 MG tablet Take 2 tablets (1,000 mg total) by mouth every 6 (six) hours. 11/29/20   Shea Evans, MD  calcium carbonate (OS-CAL) 1250 (500 Ca) MG chewable tablet Chew 1 tablet by mouth daily.    [provider]  cetirizine (ZYRTEC) 5 MG tablet Take 5 mg by mouth daily.    [provider]  ferrous sulfate 300 (60 Fe) MG/5ML syrup Take 5 mLs (300 mg total) by mouth daily. 11/29/20   Shea Evans, MD  ibuprofen (ADVIL) 600 MG tablet Take 1 tablet (600 mg total) by mouth every 6 (six) hours.  11/29/20   Shea Evans, MD  Prenatal Vit-Fe Fumarate-FA (PRENATAL MULTIVITAMIN) TABS tablet Take 1 tablet by mouth daily at 12 noon.    [provider]    Allergies    Lidocaine and Amoxicillin  Review of Systems   Review of Systems  Constitutional:  Positive for chills and fever.  Respiratory:  Negative for cough and shortness of breath.   Cardiovascular:  Negative for chest pain.  Gastrointestinal:  Positive for abdominal pain. Negative for nausea and vomiting.  Genitourinary:  Positive for dysuria, urgency and vaginal bleeding.  Skin:  Negative for color change.  All other systems reviewed and are negative.  Physical Exam Updated Vital Signs BP 117/70 (BP Location: Right Arm)   Pulse 92   Temp 100 F (37.8 C) (Oral)   Resp 16   Ht 1.6 m (5\' 3" )   Wt 68.5 kg   SpO2 98%   BMI 26.75 kg/m   Physical Exam Vitals and nursing note reviewed.  Constitutional:      Appearance: She is well-developed. She is not ill-appearing.  HENT:     Head: Normocephalic and atraumatic.     Nose: Nose normal.     Mouth/Throat:     Mouth: Mucous membranes are moist.  Eyes:     Pupils: Pupils are equal, round, and reactive to light.  Cardiovascular:     Rate and Rhythm: Normal rate and regular rhythm.     Heart sounds: Normal heart sounds.  Pulmonary:     Effort: Pulmonary effort is normal. No respiratory distress.     Breath sounds: No wheezing.  Abdominal:     Palpations: Abdomen is soft.     Tenderness: There is no abdominal tenderness. There is no right CVA tenderness, left CVA tenderness, guarding or rebound.  Musculoskeletal:     Cervical back: Neck supple.  Skin:    General: Skin is warm and dry.  Neurological:     Mental Status: She is alert and oriented to person, place, and time.  Psychiatric:        Mood and Affect: Mood normal.    ED Results / Procedures / Treatments   Labs (all labs ordered are listed, but only abnormal results are displayed) Labs  Reviewed  RESP PANEL BY RT-PCR (FLU A&B, COVID) ARPGX2  URINALYSIS, ROUTINE W REFLEX MICROSCOPIC  PREGNANCY, URINE  CBC WITH DIFFERENTIAL/PLATELET  BASIC METABOLIC PANEL  HCG, QUANTITATIVE, PREGNANCY  EKG None  Radiology No results found.  Procedures Procedures   Medications Ordered in ED Medications - No data to display  ED Course  I have reviewed the triage vital signs and the nursing notes.  Pertinent labs & imaging results that were available during my care of the patient were reviewed by me and considered in my medical decision making (see chart for details).    MDM Rules/Calculators/A&P                           Patient presents with multiple complaints.  Her main concern is that she may have an ectopic pregnancy given abnormal uterine bleeding and lower abdominal cramping.  However, she also acknowledges multiple sick contacts and urinary symptoms.  She is overall nontoxic and vital signs are notable for temperature of 100.  Her abdominal exam is benign without lateralizing point tenderness.  She has no CVA tenderness.  I would highly suspect given the prevalence of flu and COVID-19 in the community, she likely has a viral illness.  However, UTI is also a consideration.  We will obtain a beta hCG to screen for pregnancy.  We will also obtain some basic lab work.  COVID and influenza screening sent.  Patient signed out to oncoming provider.  Final Clinical Impression(s) / ED Diagnoses Final diagnoses:  None    Rx / DC Orders ED Discharge Orders     None        Nikoli Nasser, Mayer Masker, MD 06/01/21 [phone]

## 2021-06-01 NOTE — Discharge Instructions (Addendum)
You have tested positive for COVID-19.  Continue Tylenol and ibuprofen for fever and pain.  Follow-up your pregnancy test online.

## 2021-06-01 NOTE — ED Notes (Signed)
Pt discharged home after verbalizing understanding of discharge instructions; nad noted. 

## 2021-06-01 NOTE — ED Triage Notes (Signed)
Patient here POV from Home with Multiple Complaints.   Patient has been having a Fever for approximately 1 week, Lower Back Pain for approximately 3 weeks. Patient has also had a prolonged menstrual cycle for approximately 2 weeks. Patient has also been having Cramping longer than usual.   Hx of Miscarriage and Ectopic Pregnancy.   NAD Noted during Triage. A&Ox4. GCS 15. Ambulatory.

## 2021-06-16 DIAGNOSIS — J029 Acute pharyngitis, unspecified: Secondary | ICD-10-CM | POA: Diagnosis not present

## 2021-06-27 IMAGING — US US MFM FETAL BPP W/O NON-STRESS
1 series · 15 of 21 positions shown · non-contrast
Comparison: none

[Series 1: us mfm fetal bpp w/o non-stress · 21 acquisitions, 15 frames shown]
[im 1/21]
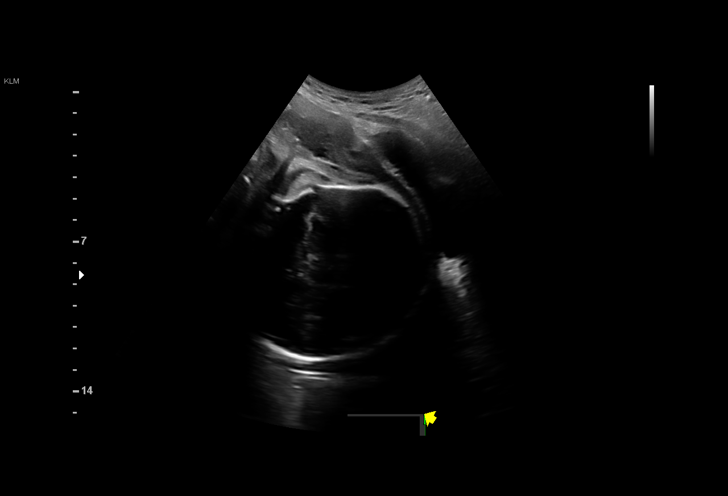
[im 3/21]
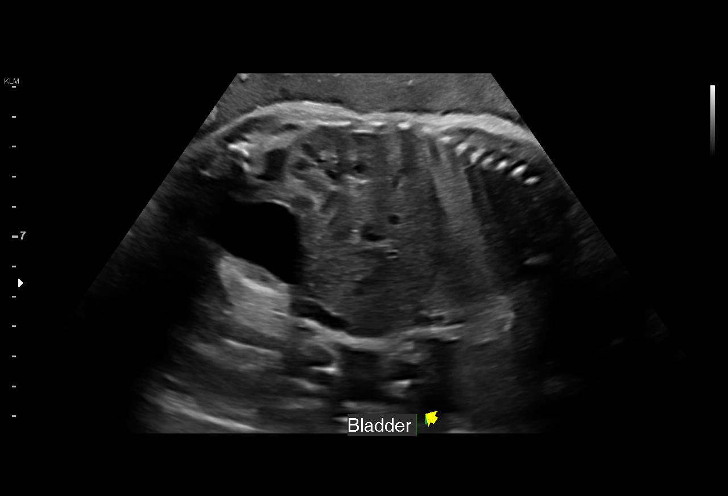
[im 4/21]
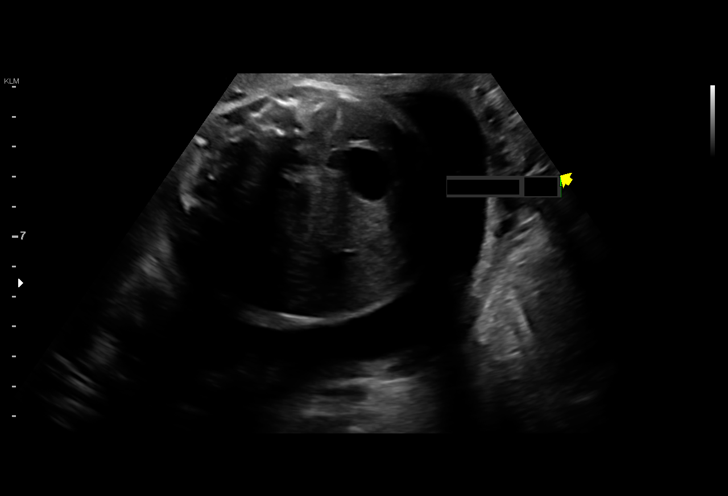
[im 5/21]
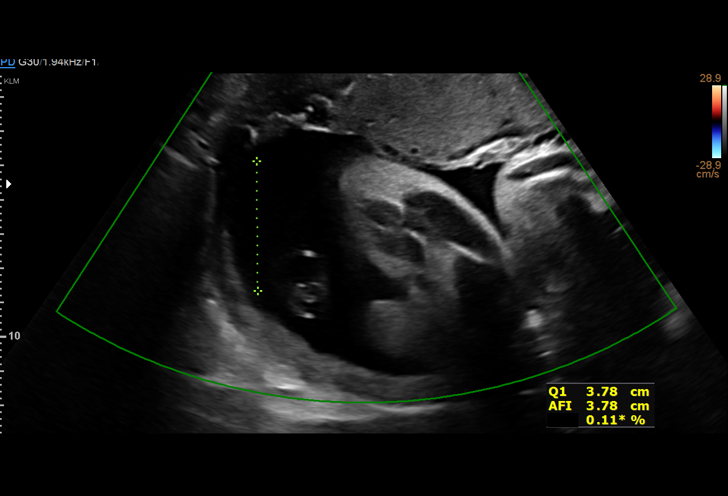
[im 7/21]
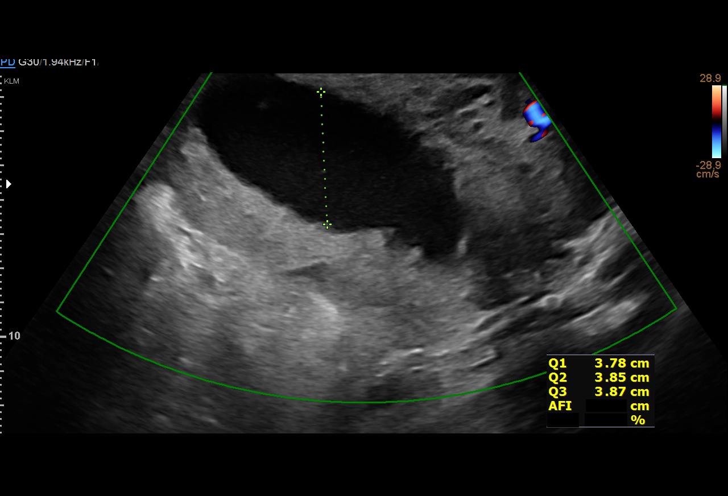
[im 8/21]
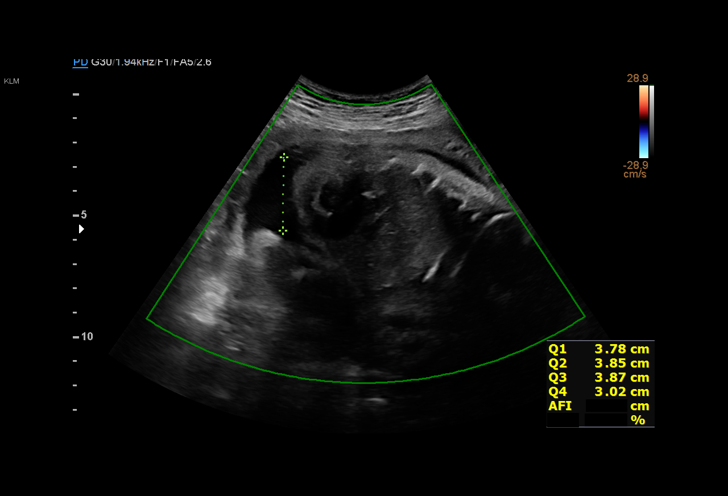
[im 10/21]
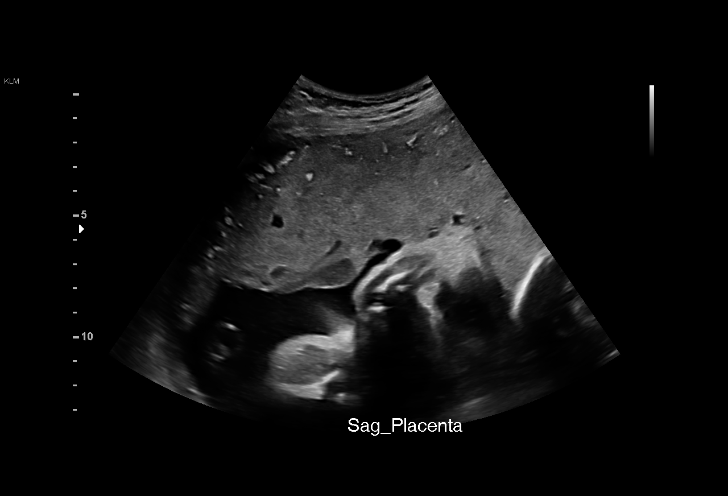
[im 11/21]
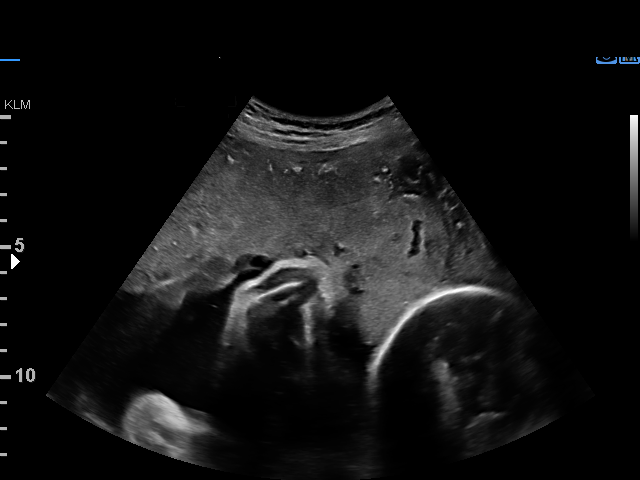
[im 12/21]
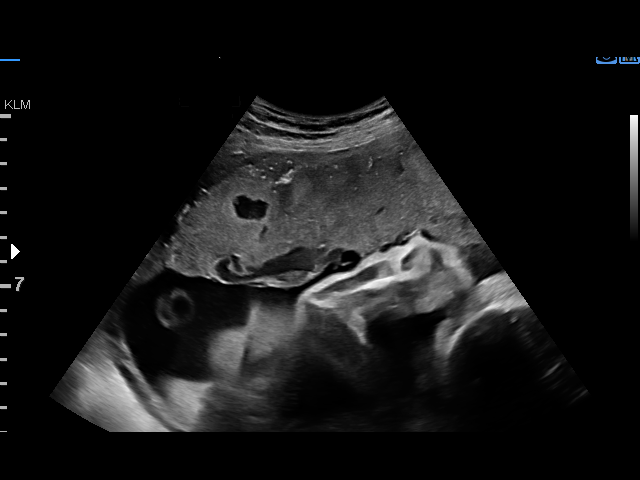
[im 14/21]
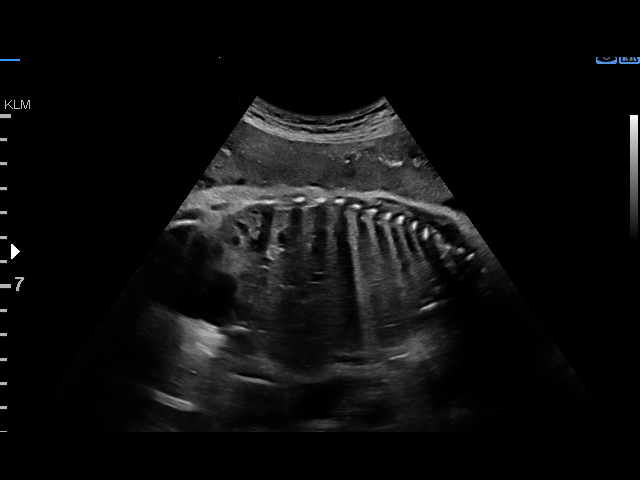
[im 15/21]
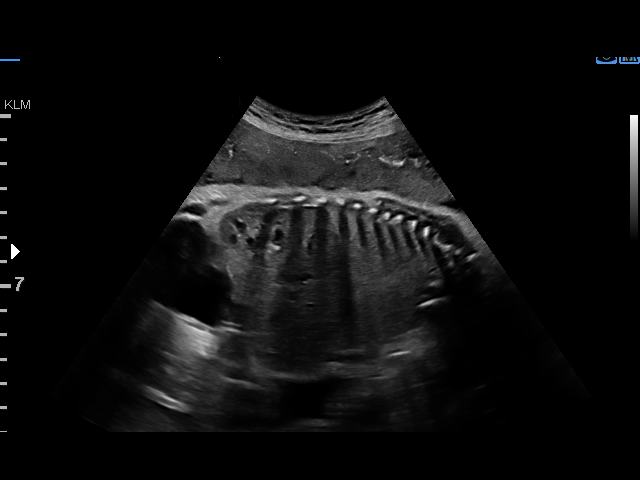
[im 17/21]
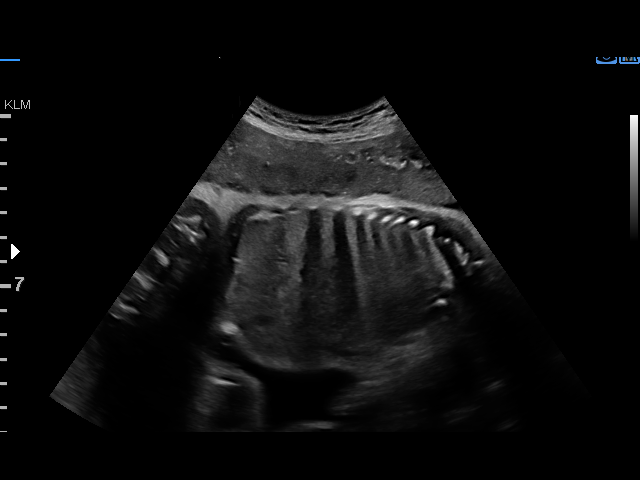
[im 18/21]
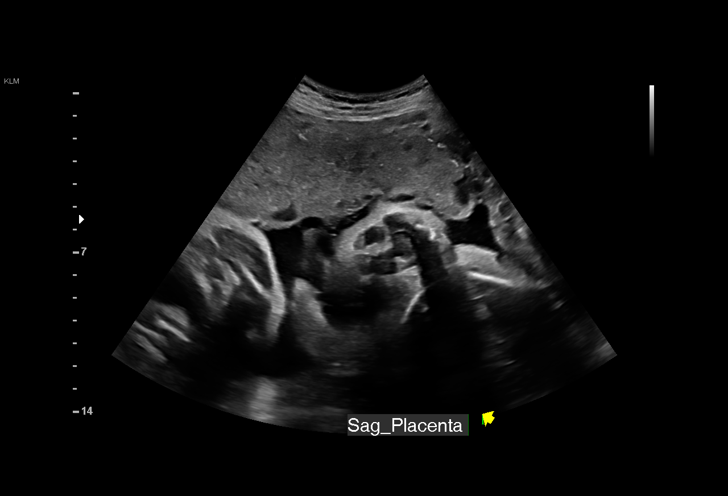
[im 19/21]
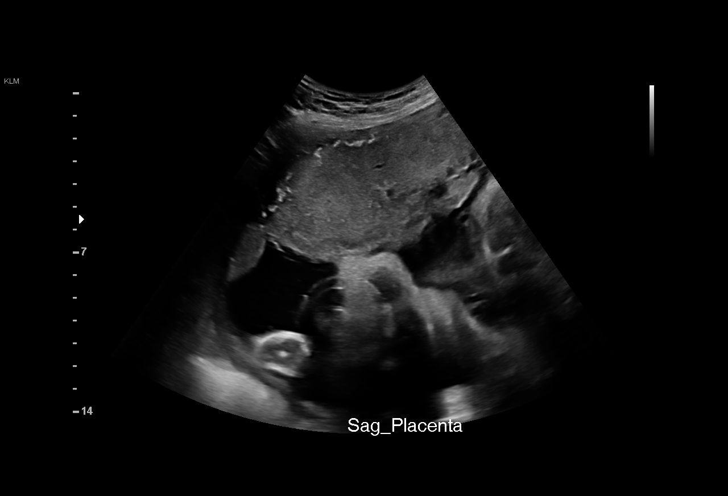
[im 21/21]
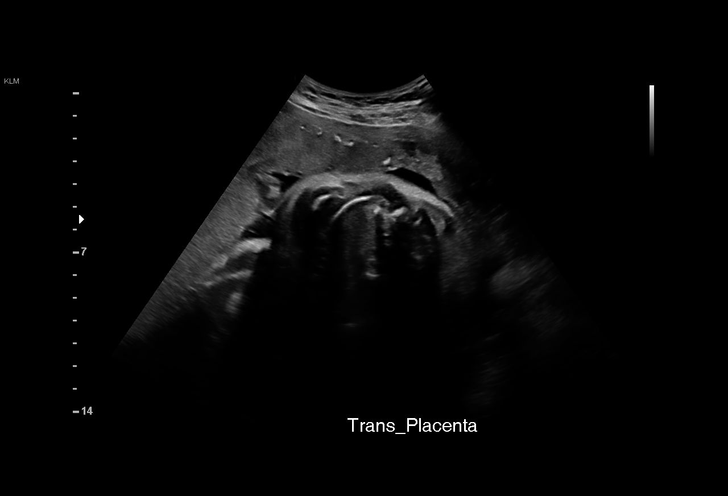

[15 of 21 positions shown; findings below may reference images not displayed]

Indications

 Decreased fetal movements, third trimester,
 unspecified
 31 weeks gestation of pregnancy
Fetal Evaluation

 Num Of Fetuses:          1
 Fetal Heart Rate(bpm):   144
 Cardiac Activity:        Observed
 Presentation:            Cephalic
 Placenta:                Anterior

 Amniotic Fluid
 AFI FV:      Within normal limits

 AFI Sum(cm)     %Tile       Largest Pocket(cm)
 14.6            51

 RUQ(cm)       RLQ(cm)        LUQ(cm)        LLQ(cm)
 3.8           3
Biophysical Evaluation

 Amniotic F.V:   Within normal limits        F. Tone:         Observed
 F. Movement:    Observed                    Score:           [DATE]
 F. Breathing:   Observed
OB History

 Gravidity:     7         Term:  2          Prem:  0        SAB:   3
 TOP:           0       Ectopic: 0         Living: 3
Gestational Age

 Clinical EDD:   31w 4d                                         EDD:  12/01/20
 Best:           31w 4d    Det. By:  Clinical EDD               EDD:  12/01/20
Anatomy

 Stomach:                Appears normal, left   Bladder:                Appears normal
                         sided
Impression

 Patient was evaluated for c/o decreased fetal movements .
 Amniotic fluid is normal and good fetal activity is seen .Antenatal
 testing is reassuring. BPP [DATE].
                 Donnerer, [place]

## 2021-07-07 DIAGNOSIS — R300 Dysuria: Secondary | ICD-10-CM | POA: Diagnosis not present

## 2021-07-08 ENCOUNTER — Other Ambulatory Visit: Payer: Self-pay

## 2021-07-08 ENCOUNTER — Emergency Department (HOSPITAL_BASED_OUTPATIENT_CLINIC_OR_DEPARTMENT_OTHER)
Admission: EM | Admit: 2021-07-08 | Discharge: 2021-07-08 | Disposition: A | Discharge status: Home / Self Care | Payer: BC Managed Care – PPO | Attending: Emergency Medicine | Admitting: Emergency Medicine

## 2021-07-08 DIAGNOSIS — N390 Urinary tract infection, site not specified: Secondary | ICD-10-CM | POA: Diagnosis not present

## 2021-07-08 DIAGNOSIS — R319 Hematuria, unspecified: Secondary | ICD-10-CM

## 2021-07-08 DIAGNOSIS — R300 Dysuria: Secondary | ICD-10-CM | POA: Diagnosis not present

## 2021-07-08 LAB — CBC WITH DIFFERENTIAL/PLATELET
Abs Immature Granulocytes: 0.01 10*3/uL (ref 0.00–0.07)
Basophils Absolute: 0.1 10*3/uL (ref 0.0–0.1)
Basophils Relative: 1 %
Eosinophils Absolute: 0.2 10*3/uL (ref 0.0–0.5)
Eosinophils Relative: 2 %
HCT: 35.9 % — ABNORMAL LOW (ref 36.0–46.0)
Hemoglobin: 12.1 g/dL (ref 12.0–15.0)
Immature Granulocytes: 0 %
Lymphocytes Relative: 30 %
Lymphs Abs: 2.2 10*3/uL (ref 0.7–4.0)
MCH: 30.3 pg (ref 26.0–34.0)
MCHC: 33.7 g/dL (ref 30.0–36.0)
MCV: 89.8 fL (ref 80.0–100.0)
Monocytes Absolute: 0.6 10*3/uL (ref 0.1–1.0)
Monocytes Relative: 9 %
Neutro Abs: 4.3 10*3/uL (ref 1.7–7.7)
Neutrophils Relative %: 58 %
Platelets: 314 10*3/uL (ref 150–400)
RBC: 4 MIL/uL (ref 3.87–5.11)
RDW: 12.4 % (ref 11.5–15.5)
WBC: 7.4 10*3/uL (ref 4.0–10.5)
nRBC: 0 % (ref 0.0–0.2)

## 2021-07-08 LAB — PREGNANCY, URINE: Preg Test, Ur: NEGATIVE

## 2021-07-08 LAB — COMPREHENSIVE METABOLIC PANEL
ALT: 6 U/L (ref 0–44)
AST: 11 U/L — ABNORMAL LOW (ref 15–41)
Albumin: 4.5 g/dL (ref 3.5–5.0)
Alkaline Phosphatase: 97 U/L (ref 38–126)
Anion gap: 8 (ref 5–15)
BUN: 14 mg/dL (ref 6–20)
CO2: 27 mmol/L (ref 22–32)
Calcium: 9.1 mg/dL (ref 8.9–10.3)
Chloride: 101 mmol/L (ref 98–111)
Creatinine, Ser: 0.71 mg/dL (ref 0.44–1.00)
GFR, Estimated: >60 mL/min (ref >60)
Glucose, Bld: 92 mg/dL (ref 70–99)
Potassium: 3.6 mmol/L (ref 3.5–5.1)
Sodium: 136 mmol/L (ref 135–145)
Total Bilirubin: 0.5 mg/dL (ref 0.3–1.2)
Total Protein: 7.4 g/dL (ref 6.5–8.1)

## 2021-07-08 LAB — URINALYSIS, ROUTINE W REFLEX MICROSCOPIC
Bilirubin Urine: NEGATIVE
Glucose, UA: NEGATIVE mg/dL
Ketones, ur: NEGATIVE mg/dL
Nitrite: NEGATIVE
Protein, ur: 100 mg/dL — AB
RBC / HPF: >50 RBC/hpf — ABNORMAL HIGH (ref 0–5)
Specific Gravity, Urine: 1.015 (ref 1.005–1.030)
WBC, UA: >50 WBC/hpf — ABNORMAL HIGH (ref 0–5)
pH: 6.5 (ref 5.0–8.0)

## 2021-07-08 MED ORDER — SODIUM CHLORIDE 0.9 % IV BOLUS
1000.0000 mL | Freq: Once | INTRAVENOUS | Status: AC
  Administered 2021-07-08: 1000 mL via INTRAVENOUS

## 2021-07-08 MED ORDER — SODIUM CHLORIDE 0.9 % IV SOLN
2.0000 g | Freq: Once | INTRAVENOUS | Status: AC
  Administered 2021-07-08: 2 g via INTRAVENOUS

## 2021-07-08 NOTE — ED Triage Notes (Signed)
Pt reports urinary frequency and painful urination x1 week with no relief with OTC medications. Pt comes in tonight with 7/10 lower back pain. Pt endorses nausea and chills at home. VSS, ambulatory without assistance, A&Ox3.   Pt took one dose of antibiotic (macrobid) tonight, prescribed by her OBGYN.

## 2021-07-08 NOTE — ED Provider Notes (Signed)
[facility] EMERGENCY DEPT Provider Note   CSN: [id number] Arrival date & time: 07/08/21  0141     History  Chief Complaint  Patient presents with   Dysuria   Back Pain    Alison Turner is a 31 y.o. female.  Patient presents to the emergency department for concern over possible kidney infection.  Patient reports that she has had frequent urinary tract infections in the past.  She started having mild symptoms a week ago and began taking over-the-counter cranberry treatment.  At that time she was having some dysuria, no hematuria.  Patient had a urinalysis at her OB/GYN, culture is pending.  She was prescribed Macrobid, took 1 dose of that so far but tonight pain became worse.  She is now experiencing pain across her lower back which she has not had previously.  No fever, vomiting.      Home Medications Prior to Admission medications   Medication Sig Start Date End Date Taking? Authorizing Provider  acetaminophen (TYLENOL) 500 MG tablet Take 2 tablets (1,000 mg total) by mouth every 6 (six) hours. 11/29/20   Azucena Fallen, MD  calcium carbonate (OS-CAL) 1250 (500 Ca) MG chewable tablet Chew 1 tablet by mouth daily.    [provider]  cetirizine (ZYRTEC) 5 MG tablet Take 5 mg by mouth daily.    [provider]  ferrous sulfate 300 (60 Fe) MG/5ML syrup Take 5 mLs (300 mg total) by mouth daily. 11/29/20   Azucena Fallen, MD  ibuprofen (ADVIL) 600 MG tablet Take 1 tablet (600 mg total) by mouth every 6 (six) hours. 11/29/20   Azucena Fallen, MD  Prenatal Vit-Fe Fumarate-FA (PRENATAL MULTIVITAMIN) TABS tablet Take 1 tablet by mouth daily at 12 noon.    [provider]      Allergies    Lidocaine and Amoxicillin    Review of Systems   Review of Systems  Genitourinary:  Positive for dysuria.  Musculoskeletal:  Positive for back pain.   Physical Exam Updated Vital Signs BP 117/81    Pulse (!) 102    Temp 98.6 F (37 C) (Oral)    Resp 17     Ht 5\' 3"  (1.6 m)    Wt 51.3 kg    SpO2 100%    Breastfeeding Yes    BMI 20.02 kg/m  Physical Exam Vitals and nursing note reviewed.  Constitutional:      General: She is not in acute distress.    Appearance: Normal appearance. She is well-developed.  HENT:     Head: Normocephalic and atraumatic.     Right Ear: Hearing normal.     Left Ear: Hearing normal.     Nose: Nose normal.  Eyes:     Conjunctiva/sclera: Conjunctivae normal.     Pupils: Pupils are equal, round, and reactive to light.  Cardiovascular:     Rate and Rhythm: Regular rhythm.     Heart sounds: S1 normal and S2 normal. No murmur heard.   No friction rub. No gallop.  Pulmonary:     Effort: Pulmonary effort is normal. No respiratory distress.     Breath sounds: Normal breath sounds.  Chest:     Chest wall: No tenderness.  Abdominal:     General: Bowel sounds are normal.     Palpations: Abdomen is soft.     Tenderness: There is no abdominal tenderness. There is right CVA tenderness and left CVA tenderness. There is no guarding or rebound. Negative signs include Murphy's sign  and McBurney's sign.     Hernia: No hernia is present.  Musculoskeletal:        General: Normal range of motion.     Cervical back: Normal range of motion and neck supple.  Skin:    General: Skin is warm and dry.     Findings: No rash.  Neurological:     Mental Status: She is alert and oriented to person, place, and time.     GCS: GCS eye subscore is 4. GCS verbal subscore is 5. GCS motor subscore is 6.     Cranial Nerves: No cranial nerve deficit.     Sensory: No sensory deficit.     Coordination: Coordination normal.  Psychiatric:        Speech: Speech normal.        Behavior: Behavior normal.        Thought Content: Thought content normal.    ED Results / Procedures / Treatments   Labs (all labs ordered are listed, but only abnormal results are displayed) Labs Reviewed  URINALYSIS, ROUTINE W REFLEX MICROSCOPIC - Abnormal;  Notable for the following components:      Result Value   APPearance HAZY (*)    Hgb urine dipstick MODERATE (*)    Protein, ur 100 (*)    Leukocytes,Ua LARGE (*)    RBC / HPF >50 (*)    WBC, UA >50 (*)    All other components within normal limits  CBC WITH DIFFERENTIAL/PLATELET - Abnormal; Notable for the following components:   HCT 35.9 (*)    All other components within normal limits  COMPREHENSIVE METABOLIC PANEL - Abnormal; Notable for the following components:   AST 11 (*)    All other components within normal limits  PREGNANCY, URINE    EKG None  Radiology No results found.  Procedures Procedures    Medications Ordered in ED Medications  cefTRIAXone (ROCEPHIN) 2 g in sodium chloride 0.9 % 100 mL IVPB (has no administration in time range)  sodium chloride 0.9 % bolus 1,000 mL (1,000 mLs Intravenous New Bag/Given 07/08/21 0222)    ED Course/ Medical Decision Making/ A&P                           Medical Decision Making Amount and/or Complexity of Data Reviewed Labs: ordered. Decision-making details documented in ED Course.  Risk Prescription drug management.   Patient presents to the emergency department for evaluation of low back pain and dysuria.  Patient started with mild urinary tract and symptoms earlier this week.  She tried to treat it with cranberry medications but symptoms persisted.  She contacted her OB/GYN who had her drop off a urine sample and prescribed Macrobid.  She has taken 1 dose without improvement.  Patient now experiencing low back pain.  She presents, afebrile.  No nausea or vomiting.  Pain is diffuse across the lower back, bilateral.  Possibly consistent with a sending infection but she does not appear to have systemic symptoms at this time.  She is nontoxic and well-appearing.   Urinalysis does show obvious signs of infection.  Presentation not consistent with kidney stone.  She is not experiencing any nausea and vomiting, and is tolerating  oral intake, therefore does not require hospitalization.Patient given a dose of Rocephin here in the emergency department, will be a candidate for outpatient management of UTI,/possible early pyelonephritis.  Given return precautions.        Final Clinical Impression(s) /  ED Diagnoses Final diagnoses:  Urinary tract infection with hematuria, site unspecified    Rx / DC Orders ED Discharge Orders     None         Charlotte Brafford, Gwenyth Allegra, MD 07/08/21 [phone]

## 2021-07-08 NOTE — Discharge Instructions (Addendum)
If you develop high fever or vomiting preventing you from taking the oral medication, return to the ER for repeat evaluation.

## 2021-07-10 LAB — URINE CULTURE: Culture: >=100000 — AB

## 2021-07-11 ENCOUNTER — Telehealth: Payer: Self-pay | Admitting: Emergency Medicine

## 2021-07-11 NOTE — Telephone Encounter (Signed)
Post ED Visit - Positive Culture Follow-up  Culture report reviewed by antimicrobial stewardship pharmacist: [facility] []  , Pharm.D. []  Enzo Bi, Pharm.D., BCPS AQ-ID []  , Pharm.D., BCPS []  Celedonio Miyamoto, Pharm.D., BCPS []  Rankin, Garvin Fila.D., BCPS, AAHIVP []  , Pharm.D., BCPS, AAHIVP []  Georgina Pillion, PharmD, BCPS []  , PharmD, BCPS []  [place], PharmD, BCPS [x]  [address], PharmD []  , PharmD, BCPS []  Estella Husk, PharmD  Pharmacy Team []  Lysle Pearl, PharmD []  , PharmD []  Phillips Climes, PharmD []  , Rph []  Agapito Games) , PharmD []  Daylene Posey, PharmD []  , PharmD []  Mervyn Gay, PharmD []  , PharmD []  Vinnie Level, PharmD []  [facility], PharmD []  , PharmD []  Len Childs, PharmD   Positive urine culture Treated with Rocephin and Macrobid, organism sensitive to the same and no further patient follow-up is required at this time.  Ray Glacken 07/11/2021, 11:44 AM

## 2021-08-26 DIAGNOSIS — E039 Hypothyroidism, unspecified: Secondary | ICD-10-CM | POA: Diagnosis not present

## 2021-08-26 DIAGNOSIS — R5383 Other fatigue: Secondary | ICD-10-CM | POA: Diagnosis not present

## 2021-08-26 DIAGNOSIS — L719 Rosacea, unspecified: Secondary | ICD-10-CM | POA: Diagnosis not present

## 2021-08-26 DIAGNOSIS — E538 Deficiency of other specified B group vitamins: Secondary | ICD-10-CM | POA: Diagnosis not present

## 2021-08-26 DIAGNOSIS — Z7409 Other reduced mobility: Secondary | ICD-10-CM | POA: Diagnosis not present

## 2021-08-26 DIAGNOSIS — E7212 Methylenetetrahydrofolate reductase deficiency: Secondary | ICD-10-CM | POA: Diagnosis not present

## 2021-08-26 DIAGNOSIS — A6920 Lyme disease, unspecified: Secondary | ICD-10-CM | POA: Diagnosis not present

## 2021-08-26 DIAGNOSIS — Z13220 Encounter for screening for lipoid disorders: Secondary | ICD-10-CM | POA: Diagnosis not present

## 2021-09-16 DIAGNOSIS — E039 Hypothyroidism, unspecified: Secondary | ICD-10-CM | POA: Diagnosis not present

## 2021-09-16 DIAGNOSIS — R5383 Other fatigue: Secondary | ICD-10-CM | POA: Diagnosis not present

## 2021-09-16 DIAGNOSIS — A6920 Lyme disease, unspecified: Secondary | ICD-10-CM | POA: Diagnosis not present

## 2021-09-16 DIAGNOSIS — L719 Rosacea, unspecified: Secondary | ICD-10-CM | POA: Diagnosis not present

## 2022-02-09 DIAGNOSIS — E039 Hypothyroidism, unspecified: Secondary | ICD-10-CM | POA: Diagnosis not present

## 2022-02-09 DIAGNOSIS — A6920 Lyme disease, unspecified: Secondary | ICD-10-CM | POA: Diagnosis not present

## 2022-02-09 DIAGNOSIS — A449 Bartonellosis, unspecified: Secondary | ICD-10-CM | POA: Diagnosis not present

## 2022-04-27 DIAGNOSIS — Z01419 Encounter for gynecological examination (general) (routine) without abnormal findings: Secondary | ICD-10-CM | POA: Diagnosis not present

## 2022-04-27 DIAGNOSIS — Z681 Body mass index (BMI) 19 or less, adult: Secondary | ICD-10-CM | POA: Diagnosis not present

## 2022-05-26 DIAGNOSIS — Z3169 Encounter for other general counseling and advice on procreation: Secondary | ICD-10-CM | POA: Diagnosis not present

## 2022-05-30 DIAGNOSIS — N200 Calculus of kidney: Secondary | ICD-10-CM | POA: Diagnosis not present

## 2022-06-04 DIAGNOSIS — N96 Recurrent pregnancy loss: Secondary | ICD-10-CM | POA: Diagnosis not present

## 2022-06-04 DIAGNOSIS — Z3200 Encounter for pregnancy test, result unknown: Secondary | ICD-10-CM | POA: Diagnosis not present

## 2022-06-04 DIAGNOSIS — E282 Polycystic ovarian syndrome: Secondary | ICD-10-CM | POA: Diagnosis not present

## 2022-06-04 DIAGNOSIS — Z3201 Encounter for pregnancy test, result positive: Secondary | ICD-10-CM | POA: Diagnosis not present

## 2022-06-04 DIAGNOSIS — Z3189 Encounter for other procreative management: Secondary | ICD-10-CM | POA: Diagnosis not present

## 2022-06-07 DIAGNOSIS — Z3189 Encounter for other procreative management: Secondary | ICD-10-CM | POA: Diagnosis not present

## 2022-06-30 DIAGNOSIS — Z3189 Encounter for other procreative management: Secondary | ICD-10-CM | POA: Diagnosis not present

## 2022-07-03 DIAGNOSIS — Z3189 Encounter for other procreative management: Secondary | ICD-10-CM | POA: Diagnosis not present

## 2022-12-19 ENCOUNTER — Other Ambulatory Visit: Payer: Self-pay

## 2022-12-19 ENCOUNTER — Emergency Department (HOSPITAL_BASED_OUTPATIENT_CLINIC_OR_DEPARTMENT_OTHER)
Admission: EM | Admit: 2022-12-19 | Discharge: 2022-12-20 | Disposition: A | Discharge status: Home / Self Care | Payer: BC Managed Care – PPO | Attending: Emergency Medicine | Admitting: Emergency Medicine

## 2022-12-19 ENCOUNTER — Encounter (HOSPITAL_BASED_OUTPATIENT_CLINIC_OR_DEPARTMENT_OTHER): Payer: Self-pay

## 2022-12-19 DIAGNOSIS — E039 Hypothyroidism, unspecified: Secondary | ICD-10-CM | POA: Diagnosis not present

## 2022-12-19 DIAGNOSIS — O2341 Unspecified infection of urinary tract in pregnancy, first trimester: Secondary | ICD-10-CM | POA: Diagnosis present

## 2022-12-19 DIAGNOSIS — Z3A10 10 weeks gestation of pregnancy: Secondary | ICD-10-CM | POA: Insufficient documentation

## 2022-12-19 DIAGNOSIS — B9689 Other specified bacterial agents as the cause of diseases classified elsewhere: Secondary | ICD-10-CM | POA: Diagnosis not present

## 2022-12-19 LAB — URINALYSIS, ROUTINE W REFLEX MICROSCOPIC
Bilirubin Urine: NEGATIVE
Glucose, UA: NEGATIVE mg/dL
Hgb urine dipstick: NEGATIVE
Ketones, ur: NEGATIVE mg/dL
Nitrite: NEGATIVE
Specific Gravity, Urine: 1.023 (ref 1.005–1.030)
pH: 5.5 (ref 5.0–8.0)

## 2022-12-19 LAB — PREGNANCY, URINE: Preg Test, Ur: POSITIVE — AB

## 2022-12-19 NOTE — ED Triage Notes (Addendum)
POV from home, A&O x 4, GCS 15, amb to triage  C/o lower abd pain, bilateral lower back pain, 10 weeks preg, sts fevers today and urinary frequency with urgency. hx of UTI and thinks it could be the same. Denies spotting or bleeding but does have hx of miscarriage.

## 2022-12-20 MED ORDER — CEPHALEXIN 500 MG PO CAPS: 500.0000 mg | ORAL_CAPSULE | Freq: Two times a day (BID) | ORAL | 0 refills | Status: AC

## 2022-12-20 MED ORDER — CEPHALEXIN 250 MG PO CAPS
500.0000 mg | ORAL_CAPSULE | Freq: Once | ORAL | Status: AC
  Administered 2022-12-20: 500 mg via ORAL
  Filled 2022-12-20: qty 2

## 2022-12-20 NOTE — ED Provider Notes (Signed)
[facility] Provider Note: Lowella Dell, MD, FACEP  CSN: [id number] MRN: [id number] ARRIVAL: 12/19/22 at 2110 ROOM: DB007/DB007   CHIEF COMPLAINT  Dysuria   HISTORY OF PRESENT ILLNESS  12/20/22 12:27 AM Alison Turner is a 32 y.o. female who is about [redacted] weeks pregnant.  Yesterday she developed a fever and urinary frequency and urgency.  Symptoms are consistent with previous urinary tract infections.  She is also having lower abdominal pain and lower back pain.  She is not having any vaginal discharge or bleeding.  She rates her pain as a 7 out of 10.   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   scarring removed via hysteroscopy   Ectopic pregnancy    received MTX   Headache    Hypothyroidism    has resolved   Infection    Kidney calculi    Kidney stones    Ovarian cyst    Traumatic injury during pregnancy in third trimester 06/19/2015    Past Surgical History:  Procedure Laterality Date   MOUTH SURGERY      Family History  Problem Relation Age of Onset   Hypertension Mother    Hyperthyroidism Mother    Kidney disease Father    Hyperlipidemia Father    Cancer Maternal Grandmother    Diabetes Maternal Grandmother    Rheum arthritis Maternal Grandmother    Kidney disease Maternal Grandfather    Heart disease Maternal Grandfather    Stroke Maternal Grandfather    Rheum arthritis Maternal Grandfather    Hypertension Paternal Grandmother    Heart disease Paternal Grandmother    Stroke Paternal Grandmother    Rheum arthritis Paternal Grandmother    Kidney disease Paternal Grandfather    Heart disease Paternal Grandfather    Hypertension Paternal Grandfather    Rheum arthritis Paternal Actor    Healthy Sister    Healthy Brother    Healthy Sister    Healthy Sister    Healthy Sister    Healthy Brother    Healthy Brother     Social History   Tobacco Use   Smoking status: Never   Smokeless tobacco:  Never   Tobacco comments:    tried a few puffs, never inhaled  Vaping Use   Vaping Use: Never used  Substance Use Topics   Alcohol use: Not Currently    Comment: Occasionally   Drug use: Never    Prior to Admission medications   Medication Sig Start Date End Date Taking? Authorizing Provider  cephALEXin (KEFLEX) 500 MG capsule Take 1 capsule (500 mg total) by mouth 2 (two) times daily for 5 days. 12/20/22 12/25/22 Yes Lynessa Almanzar, MD  acetaminophen (TYLENOL) 500 MG tablet Take 2 tablets (1,000 mg total) by mouth every 6 (six) hours. 11/29/20   Shea Evans, MD  calcium carbonate (OS-CAL) 1250 (500 Ca) MG chewable tablet Chew 1 tablet by mouth daily.    [provider]  cetirizine (ZYRTEC) 5 MG tablet Take 5 mg by mouth daily.    [provider]  ferrous sulfate 300 (60 Fe) MG/5ML syrup Take 5 mLs (300 mg total) by mouth daily. 11/29/20   Shea Evans, MD  ibuprofen (ADVIL) 600 MG tablet Take 1 tablet (600 mg total) by mouth every 6 (six) hours. 11/29/20   Shea Evans, MD  Prenatal Vit-Fe Fumarate-FA (PRENATAL MULTIVITAMIN) TABS tablet Take 1 tablet by mouth daily at 12 noon.    [provider]  Allergies Lidocaine and Amoxicillin   REVIEW OF SYSTEMS  Negative except as noted here or in the History of Present Illness.   PHYSICAL EXAMINATION  Initial Vital Signs Blood pressure 109/74, pulse 95, temperature 98.6 F (37 C), resp. rate 18, height 5\' 3"  (1.6 m), weight 53.1 kg, last menstrual period 10/09/2022, SpO2 100 %, currently breastfeeding.  Examination General: Well-developed, well-nourished female in no acute distress; appearance consistent with age of record HENT: normocephalic; atraumatic Eyes: Normal appearance Neck: supple Heart: regular rate and rhythm Lungs: clear to auscultation bilaterally Abdomen: soft; nondistended; nontender; bowel sounds present GU: No CVA tenderness Extremities: No deformity; full range of motion; pulses  normal Neurologic: Awake, alert and oriented; motor function intact in all extremities and symmetric; no facial droop Skin: Warm and dry Psychiatric: Normal mood and affect   RESULTS  Summary of this visit's results, reviewed and interpreted by myself:   EKG Interpretation  Date/Time:    Ventricular Rate:    PR Interval:    QRS Duration:   QT Interval:    QTC Calculation:   R Axis:     Text Interpretation:         Laboratory Studies: Results for orders placed or performed during the hospital encounter of 12/19/22 (from the past 24 hour(s))  Urinalysis, Routine w reflex microscopic -Urine, Clean Catch     Status: Abnormal   Collection Time: 12/19/22  9:26 PM  Result Value Ref Range   Color, Urine YELLOW YELLOW   APPearance CLEAR CLEAR   Specific Gravity, Urine 1.023 1.005 - 1.030   pH 5.5 5.0 - 8.0   Glucose, UA NEGATIVE NEGATIVE mg/dL   Hgb urine dipstick NEGATIVE NEGATIVE   Bilirubin Urine NEGATIVE NEGATIVE   Ketones, ur NEGATIVE NEGATIVE mg/dL   Protein, ur TRACE (A) NEGATIVE mg/dL   Nitrite NEGATIVE NEGATIVE   Leukocytes,Ua TRACE (A) NEGATIVE   RBC / HPF 0-5 0 - 5 RBC/hpf   WBC, UA 21-50 0 - 5 WBC/hpf   Bacteria, UA RARE (A) NONE SEEN   Squamous Epithelial / HPF 0-5 0 - 5 /HPF   Mucus PRESENT   Pregnancy, urine     Status: Abnormal   Collection Time: 12/19/22  9:26 PM  Result Value Ref Range   Preg Test, Ur POSITIVE (A) NEGATIVE   Imaging Studies: No results found.  ED COURSE and MDM  Nursing notes, initial and subsequent vitals signs, including pulse oximetry, reviewed and interpreted by myself.  Vitals:   12/19/22 2116 12/19/22 2119 12/19/22 2230 12/19/22 2300  BP:  92/60 99/68 109/74  Pulse:  88 85 95  Resp:  17 16 18   Temp:  98.6 F (37 C)    SpO2:  100% 100% 100%  Weight: 53.1 kg     Height: 5\' 3"  (1.6 m)      Medications  cephALEXin (KEFLEX) capsule 500 mg (has no administration in time range)    Urinalysis is consistent with a urinary  tract infection.  Urine has been sent for culture.  We will treat with Keflex.  She has not yet had her first prenatal care appointment.  PROCEDURES  Procedures   ED DIAGNOSES     ICD-10-CM   1. UTI in pregnancy, antepartum, first trimester  O23.41          Euan Wandler, MD 12/20/22 [phone]

## 2022-12-22 LAB — URINE CULTURE: Culture: 80000 — AB

## 2022-12-23 ENCOUNTER — Telehealth (HOSPITAL_BASED_OUTPATIENT_CLINIC_OR_DEPARTMENT_OTHER): Payer: Self-pay | Admitting: *Deleted

## 2022-12-23 NOTE — Telephone Encounter (Signed)
Post ED Visit - Positive Culture Follow-up  Culture report reviewed by antimicrobial stewardship pharmacist: [facility] [x]  Andreas Ohm, Pharm.D. []  Celedonio Miyamoto, Pharm.D., BCPS AQ-ID []  Garvin Fila, Pharm.D., BCPS []  Georgina Pillion, Pharm.D., BCPS []  [place], [address].D., BCPS, AAHIVP []  Estella Husk, Pharm.D., BCPS, AAHIVP []  Lysle Pearl, PharmD, BCPS []  Phillips Climes, PharmD, BCPS []  Agapito Games, PharmD, BCPS []  Verlan Friends, PharmD []  Mervyn Gay, PharmD, BCPS []  Vinnie Level, PharmD  [facility] []  Len Childs, PharmD []  Greer Pickerel, PharmD []  Adalberto Cole, PharmD []  Perlie Gold, Rph []  Lonell Face) Jean Rosenthal, PharmD []  Earl Many, PharmD []  Junita Push, PharmD []  Dorna Leitz, PharmD []  Terrilee Files, PharmD []  Lynann Beaver, PharmD []  Keturah Barre, PharmD []  Loralee Pacas, PharmD []  Bernadene Person, PharmD   Positive urine culture Treated with Cephalexin, organism sensitive to the same and no further patient follow-up is required at this time.  Virl Axe [facility] 12/23/2022, 11:12 AM

## 2022-12-31 LAB — OB RESULTS CONSOLE ANTIBODY SCREEN: Antibody Screen: NEGATIVE

## 2022-12-31 LAB — OB RESULTS CONSOLE RPR: RPR: NONREACTIVE

## 2022-12-31 LAB — OB RESULTS CONSOLE HIV ANTIBODY (ROUTINE TESTING): HIV: NONREACTIVE

## 2022-12-31 LAB — OB RESULTS CONSOLE RUBELLA ANTIBODY, IGM: Rubella: IMMUNE

## 2022-12-31 LAB — OB RESULTS CONSOLE HEPATITIS B SURFACE ANTIGEN: Hepatitis B Surface Ag: NEGATIVE

## 2022-12-31 LAB — OB RESULTS CONSOLE GC/CHLAMYDIA
Chlamydia: NEGATIVE
Neisseria Gonorrhea: NEGATIVE

## 2022-12-31 LAB — OB RESULTS CONSOLE ABO/RH: RH Type: POSITIVE

## 2022-12-31 LAB — HEPATITIS C ANTIBODY: HCV Ab: NEGATIVE

## 2023-06-11 ENCOUNTER — Inpatient Hospital Stay (HOSPITAL_COMMUNITY)
Admission: AD | Admit: 2023-06-11 | Discharge: 2023-06-11 | Disposition: A | Discharge status: Home / Self Care | Payer: BC Managed Care – PPO | Attending: Obstetrics and Gynecology | Admitting: Obstetrics and Gynecology

## 2023-06-11 ENCOUNTER — Encounter (HOSPITAL_COMMUNITY): Payer: Self-pay

## 2023-06-11 DIAGNOSIS — O9A213 Injury, poisoning and certain other consequences of external causes complicating pregnancy, third trimester: Secondary | ICD-10-CM | POA: Insufficient documentation

## 2023-06-11 DIAGNOSIS — W109XXA Fall (on) (from) unspecified stairs and steps, initial encounter: Secondary | ICD-10-CM | POA: Diagnosis not present

## 2023-06-11 DIAGNOSIS — Z3A35 35 weeks gestation of pregnancy: Secondary | ICD-10-CM | POA: Insufficient documentation

## 2023-06-11 DIAGNOSIS — W19XXXA Unspecified fall, initial encounter: Secondary | ICD-10-CM

## 2023-06-11 DIAGNOSIS — O0940 Supervision of pregnancy with grand multiparity, unspecified trimester: Secondary | ICD-10-CM

## 2023-06-11 DIAGNOSIS — T1490XA Injury, unspecified, initial encounter: Secondary | ICD-10-CM | POA: Diagnosis present

## 2023-06-11 MED ORDER — SODIUM CHLORIDE 0.9 % IV SOLN
1000.0000 mL | INTRAVENOUS | Status: DC
  Administered 2023-06-11: 1000 mL via INTRAVENOUS

## 2023-06-11 MED ORDER — SODIUM CHLORIDE 0.9 % IV SOLN
510.0000 mg | Freq: Once | INTRAVENOUS | Status: DC
  Filled 2023-06-11: qty 17

## 2023-06-11 MED ORDER — SODIUM CHLORIDE 0.9 % IV SOLN: 510.0000 mg | Freq: Once | INTRAVENOUS | Status: DC

## 2023-06-11 NOTE — MAU Note (Signed)
..  Alison Turner is a 33 y.o. at [redacted]w[redacted]d here in MAU reporting: around 0830 this morning she slipped on her top step and fell on her back, halfway down her stairs. States she ended up with her left foot up on her stair rail. Did not hit her abdomen and feels her baby move. Denies VB or LOF.   Pain score: back- 2 Vitals:   06/11/23 1003  BP: 103/61  Pulse: (!) 110  Resp: 14  Temp: 98.6 F (37 C)  SpO2: 100%     FHT:125 Lab orders placed from triage: none

## 2023-06-11 NOTE — Progress Notes (Signed)
Pt informed nurse that D. Renae Fickle CNM is talking with her via text message and does not agree with feraheme order due to pt previous reaction to venofer. Dr. Judd Lien notified of D. Pauls message.

## 2023-06-11 NOTE — MAU Provider Note (Signed)
Fall  S [name] is a 32 y.o. [phone] pregnant female at [redacted]w[redacted]d who presents to MAU today with complaint of fall down stairs morning of 06/11/23.  Pt reports slipped on her top step and fell on her back, halfway down her stairs.  She states as she was sliding down the stairs she was trying to stop herself with her LLE and was able to.  LLE and back sore but states nothing compared to her hx of un-medicated births, "everything's achy." Denies abdomen impact, positive fetal movement.  Denies VB, LOF, ctx.     Receives care at [facility]. Prenatal records reviewed.  Pertinent items noted in HPI and remainder of comprehensive ROS otherwise negative.   O BP 116/77   Pulse 92   Temp 98.6 F (37 C) (Oral)   Resp 14   Wt 68.5 kg   LMP 10/09/2022 (Exact Date)   SpO2 100%   BMI 26.75 kg/m  Physical Exam Vitals and nursing note reviewed.  Constitutional:      Appearance: Normal appearance.  HENT:     Head: Normocephalic and atraumatic.     Right Ear: External ear normal.     Left Ear: External ear normal.     Nose: Nose normal.     Mouth/Throat:     Mouth: Mucous membranes are moist.     Pharynx: Oropharynx is clear.  Eyes:     Extraocular Movements: Extraocular movements intact.     Conjunctiva/sclera: Conjunctivae normal.  Cardiovascular:     Rate and Rhythm: Normal rate.  Pulmonary:     Effort: Pulmonary effort is normal. No respiratory distress.  Abdominal:     General: Bowel sounds are normal.     Palpations: Abdomen is soft.  Musculoskeletal:        General: No swelling, tenderness or deformity. Normal range of motion.     Cervical back: Normal range of motion.  Skin:    General: Skin is warm and dry.     Comments: L big toe with bandage that is clean, dry w/o shadowing and some slightly abrasions of proximal medial aspect of L big toe. No bruising noted  Neurological:     Mental Status: She is alert and oriented to person, place, and time. Mental status is at  baseline.     Motor: No weakness.     Gait: Gait normal.  Psychiatric:        Mood and Affect: Mood normal.        Behavior: Behavior normal.      MDM: moderate  MAU Course:  No VB, no abdominal trauma, benign PE of spine, LLE and abdomen.  NST 120bpm, moderate variability, +accels, no decels, no ctx, cat 1.  Will watch for full 4hrs.   Patient doing well, no complaints.  States her CNM reached out to her and asked if we could do infusion of iron given low hgb.  Pt requesting that given another hour and half of observation time, confirmed with pharmacy that with hx of venofer induced diarrhea and subsequent syncope that the Feraheme infusion would be safe option and be able to be infused prior to d/c.  Feraheme 510mg  ordered.    Notified by patient that CNM reached back out and concerned about Feraheme.  Called CNM Arlan Organ, who states that though the two are separate formulations diarrhea is a common side effect of Feraheme much like Venofer and given pt's significant reaction in the past to Venofer she would much  prefer patient to get Injectafer OP than risk potential side effects to Feraheme.  I was under impression we did not have Injectafer at [facility] when I spoke with pharmacy earlier in trying to order a suitable iron infusion for pt at [facility] and patient request.  CNM states she was able to in the past get the [facility] sent over from [facility].  Given patient only with about of observation left before being stable for d/c I was concerned of being available to coordinate this service in reasonable time frame, especially given no acute need.  CNM agreeable to organize OP treatment. Informed pt of the discussion and patient agreeable to have OP management given asymptomatic at this time and unknown time constraints of trying to coordinate [facility] from [facility] [facility]. Feraheme order canceled.  Will continue pIVF while remaining observation continues per patient  request.  NST reviewed again with stable baseline in 120s, moderate variability, no decels, irregular / rare contractions.  Pt continues to deny ctx or VB / LOF at this time.  Stable for d/c.    A&P: #[redacted]weeks gestation #Fall    Discharge from MAU in stable condition with strict/usual precautions Follow up at [facility] as scheduled for ongoing prenatal care  Allergies as of 06/11/2023       Reactions   Lidocaine Itching, Nausea Only, Other (See Comments)      Amoxicillin Rash        Medication List     TAKE these medications    acetaminophen 500 MG tablet Commonly known as: TYLENOL Take 2 tablets (1,000 mg total) by mouth every 6 (six) hours.   calcium carbonate 1250 (500 Ca) MG chewable tablet Commonly known as: OS-CAL Chew 1 tablet by mouth daily.   cetirizine 5 MG tablet Commonly known as: ZYRTEC Take 5 mg by mouth daily.   ferrous sulfate 300 (60 Fe) MG/5ML syrup Take 5 mLs (300 mg total) by mouth daily.   ibuprofen 600 MG tablet Commonly known as: ADVIL Take 1 tablet (600 mg total) by mouth every 6 (six) hours.   prenatal multivitamin Tabs tablet Take 1 tablet by mouth daily at 12 noon.        Hessie Dibble, MD 06/11/2023 2:18 PM

## 2023-06-14 ENCOUNTER — Other Ambulatory Visit: Payer: Self-pay

## 2023-06-14 DIAGNOSIS — O99013 Anemia complicating pregnancy, third trimester: Secondary | ICD-10-CM

## 2023-06-16 ENCOUNTER — Non-Acute Institutional Stay (HOSPITAL_COMMUNITY)
Admission: RE | Admit: 2023-06-16 | Discharge: 2023-06-16 | Disposition: A | Discharge status: Home / Self Care | Payer: BC Managed Care – PPO | Source: Ambulatory Visit | Attending: Internal Medicine | Admitting: Internal Medicine

## 2023-06-16 DIAGNOSIS — O99013 Anemia complicating pregnancy, third trimester: Secondary | ICD-10-CM | POA: Diagnosis present

## 2023-06-16 DIAGNOSIS — Z3A35 35 weeks gestation of pregnancy: Secondary | ICD-10-CM | POA: Insufficient documentation

## 2023-06-16 MED ORDER — DIPHENHYDRAMINE HCL 25 MG PO CAPS
25.0000 mg | ORAL_CAPSULE | Freq: Once | ORAL | Status: AC
Start: 2023-06-16 — End: 2023-06-16
  Administered 2023-06-16: 25 mg via ORAL
  Filled 2023-06-16: qty 1

## 2023-06-16 MED ORDER — SODIUM CHLORIDE 0.9 % IV SOLN: INTRAVENOUS | Status: DC | PRN

## 2023-06-16 MED ORDER — FERRIC CARBOXYMALTOSE 750 MG/15ML IV SOLN
1000.0000 mg | Freq: Once | INTRAVENOUS | Status: DC
  Filled 2023-06-16 (×2): qty 20

## 2023-06-16 MED ORDER — SODIUM CHLORIDE 0.9 % IV SOLN
1000.0000 mg | Freq: Once | INTRAVENOUS | Status: AC
  Administered 2023-06-16: 1000 mg via INTRAVENOUS
  Filled 2023-06-16: qty 20

## 2023-06-16 MED ORDER — ACETAMINOPHEN 500 MG PO TABS: 500.0000 mg | ORAL_TABLET | Freq: Once | ORAL | Status: DC

## 2023-06-16 NOTE — Progress Notes (Signed)
PATIENT CARE CENTER NOTE   Diagnosis: Anemia int pregnancy - third trimester    Provider: Arlan Organ, CNM   Procedure: Injectafer infusion    Note: Patient received Injectafer 1000 mg infusion (dose # 1 of 1) via PIV. Patient premedicated with 25 mg PO Benadryl. 500 mg Tylenol also ordered but patient refused. Patient tolerated infusion well with no adverse reaction. Observed patient for 30 minutes post infusion. Vital signs stable. Discharge instructions given. Patient alert, oriented and ambulatory at discharge.

## 2023-06-20 LAB — OB RESULTS CONSOLE GBS: GBS: NEGATIVE

## 2023-06-27 ENCOUNTER — Other Ambulatory Visit: Payer: Self-pay | Admitting: Certified Nurse Midwife

## 2023-06-27 DIAGNOSIS — O99013 Anemia complicating pregnancy, third trimester: Secondary | ICD-10-CM

## 2023-07-04 ENCOUNTER — Non-Acute Institutional Stay (HOSPITAL_COMMUNITY)
Admission: RE | Admit: 2023-07-04 | Discharge: 2023-07-04 | Disposition: A | Discharge status: Home / Self Care | Payer: BC Managed Care – PPO | Source: Ambulatory Visit | Attending: Internal Medicine | Admitting: Internal Medicine

## 2023-07-04 DIAGNOSIS — Z3A00 Weeks of gestation of pregnancy not specified: Secondary | ICD-10-CM | POA: Insufficient documentation

## 2023-07-04 DIAGNOSIS — O99013 Anemia complicating pregnancy, third trimester: Secondary | ICD-10-CM | POA: Diagnosis present

## 2023-07-04 MED ORDER — SODIUM CHLORIDE 0.9 % IV SOLN: INTRAVENOUS | Status: DC | PRN

## 2023-07-04 MED ORDER — FERRIC CARBOXYMALTOSE 750 MG/15ML IV SOLN
1000.0000 mg | Freq: Once | INTRAVENOUS | Status: DC
Start: 2023-07-04 — End: 2023-07-04
  Filled 2023-07-04: qty 20

## 2023-07-04 MED ORDER — SODIUM CHLORIDE 0.9 % IV SOLN
1000.0000 mg | Freq: Once | INTRAVENOUS | Status: AC
  Administered 2023-07-04: 1000 mg via INTRAVENOUS
  Filled 2023-07-04: qty 20

## 2023-07-04 MED ORDER — ACETAMINOPHEN 500 MG PO TABS: 500.0000 mg | ORAL_TABLET | ORAL | Status: DC

## 2023-07-04 MED ORDER — DIPHENHYDRAMINE HCL 25 MG PO CAPS: 25.0000 mg | ORAL_CAPSULE | ORAL | Status: DC

## 2023-07-04 NOTE — Progress Notes (Signed)
PATIENT CARE CENTER NOTE  Diagnosis: Anemia complicating pregnancy in third trimester  Provider: June Leap, CNM   Procedure: Injectafer infusion  Note: Patient received Injectafer 1,000 mg infusion (dose #1 of 1) via PIV. Tylenol and Benadryl PO ordered as pre-medications. Pt refused both pre-medications. Pt tolerated infusion with no adverse reaction. Vital signs are stable. Pt observed for 30 minutes post infusion. AVS offered, but pt declined. Pt is alert, oriented, and ambulatory at discharge.

## 2023-07-06 NOTE — L&D Delivery Note (Signed)
      Delivery Note:   [name] at [redacted]w[redacted]d  Admitting diagnosis: Normal labor [O80, Z37.9] Risks: Hx precipitous birth and PPH, plan PIV and TXA at delivery. Hx RPL, progesterone  support in 1T. SAB x 4, ectopic x 1, this pregnancy was spontaneous with timed IC.  Severe ID anemia, adverse reaction to Venofer  in the past, s/p 2 doses IV iron  (tolerates Injectofer) Hx hypothyroid, stable off meds, normal TFT's q trimester    First Stage:  Induction of labor:membrane sweep in office x 1 on 07/11/2023 Onset of labor: 07/12/2023 10:00 am, early labor Augmentation: N/A ROM: SROM clear fluid 07/13/2023 at 0240 Active labor onset: 1600 07/13/2023  Hydrotherapy: tub time  intermittent tub time after admit, in tub for delivery after SROM Analgesia /Anesthesia/Pain control intrapartum: None  Patient labored expectantly, declined AROM augmentation, minimal discomfort with contractions noted until SROM when she started transition through 4-5 contractions. Excellent control in labor.   Second Stage:  Complete dilation at   0315 Onset of pushing at 0315 FHR second stage category 1 via doppler   Pushing instinctively with minimal guidance  in L lateral position in tub with CNM and L&D staff support, Lynwood present for birth and supportive. Nuchal Cord: No  Delivery of a Live born female  Birth Weight:  pending APGAR: 7, 9  Newborn Delivery   Birth date/time: 07/13/2023 03:27:34 Delivery type: Vaginal, Spontaneous     in cephalic presentation, position OA to LOT. Head delivered in recumbent position. Shoulders attempted to deliver in same position but not moving easily, patient repositioned in HK in tub, gentle guided traction delivered posterior shoulder then body followed with ease, baby guided between mother's legs to her front and she lifted baby to the surface. Newborn with spontaneous cry, vigorous tone, held by mother in her arms.   Cord double clamped after cessation of pulsation, cut by Lynwood.   Collection of cord blood for typing completed. Cord blood donation-None Arterial cord blood sample-No   Third Stage:  Patient assisted out of water to bed after small gush of blood marked placental separation. Placenta delivered 07/13/2023 at 0334 -Spontaneous with 3 vessels. Uterine tone firm, bleeding small. Uterotonics: Pitocin  IV bolus and TXA 1 gm prophylactic given after baby delivered. Placenta to mother per request, inspected and appears intact, central cord insertion..  None laceration identified.  Episiotomy:None Local analgesia: NA  Repair:NA Est. Blood Loss (mL): 300 Complications: None   Mom to postpartum.  Baby boy/undecided name to Couplet care / Skin to Skin.  Delivery Report:  Review the Delivery Report for details.     Signed: Ismael JAYSON Mt, CNM, MSN 07/13/2023, 4:01 AM

## 2023-07-07 ENCOUNTER — Encounter (HOSPITAL_COMMUNITY): Payer: Self-pay | Admitting: Obstetrics

## 2023-07-07 ENCOUNTER — Inpatient Hospital Stay (HOSPITAL_COMMUNITY)
Admission: AD | Admit: 2023-07-07 | Discharge: 2023-07-08 | Disposition: A | Discharge status: Home / Self Care | Payer: BC Managed Care – PPO | Attending: Obstetrics | Admitting: Obstetrics

## 2023-07-07 DIAGNOSIS — O99613 Diseases of the digestive system complicating pregnancy, third trimester: Secondary | ICD-10-CM | POA: Diagnosis present

## 2023-07-07 DIAGNOSIS — K219 Gastro-esophageal reflux disease without esophagitis: Secondary | ICD-10-CM | POA: Diagnosis not present

## 2023-07-07 DIAGNOSIS — O99013 Anemia complicating pregnancy, third trimester: Secondary | ICD-10-CM | POA: Insufficient documentation

## 2023-07-07 DIAGNOSIS — R042 Hemoptysis: Secondary | ICD-10-CM | POA: Diagnosis not present

## 2023-07-07 DIAGNOSIS — Z1152 Encounter for screening for COVID-19: Secondary | ICD-10-CM | POA: Diagnosis not present

## 2023-07-07 DIAGNOSIS — Z3A38 38 weeks gestation of pregnancy: Secondary | ICD-10-CM | POA: Diagnosis not present

## 2023-07-07 DIAGNOSIS — Z87442 Personal history of urinary calculi: Secondary | ICD-10-CM | POA: Diagnosis not present

## 2023-07-07 DIAGNOSIS — O26893 Other specified pregnancy related conditions, third trimester: Secondary | ICD-10-CM | POA: Diagnosis not present

## 2023-07-07 DIAGNOSIS — Z3689 Encounter for other specified antenatal screening: Secondary | ICD-10-CM | POA: Diagnosis not present

## 2023-07-07 LAB — CBC
HCT: 30 % — ABNORMAL LOW (ref 36.0–46.0)
Hemoglobin: 10.1 g/dL — ABNORMAL LOW (ref 12.0–15.0)
MCH: 31.5 pg (ref 26.0–34.0)
MCHC: 33.7 g/dL (ref 30.0–36.0)
MCV: 93.5 fL (ref 80.0–100.0)
Platelets: 173 10*3/uL (ref 150–400)
RBC: 3.21 MIL/uL — ABNORMAL LOW (ref 3.87–5.11)
RDW: 15.9 % — ABNORMAL HIGH (ref 11.5–15.5)
WBC: 7.4 10*3/uL (ref 4.0–10.5)
nRBC: 0 % (ref 0.0–0.2)

## 2023-07-07 LAB — COMPREHENSIVE METABOLIC PANEL
ALT: 16 U/L (ref 0–44)
AST: 26 U/L (ref 15–41)
Albumin: 2.8 g/dL — ABNORMAL LOW (ref 3.5–5.0)
Alkaline Phosphatase: 125 U/L (ref 38–126)
Anion gap: 9 (ref 5–15)
BUN: 5 mg/dL — ABNORMAL LOW (ref 6–20)
CO2: 20 mmol/L — ABNORMAL LOW (ref 22–32)
Calcium: 8.6 mg/dL — ABNORMAL LOW (ref 8.9–10.3)
Chloride: 105 mmol/L (ref 98–111)
Creatinine, Ser: 0.68 mg/dL (ref 0.44–1.00)
GFR, Estimated: >60 mL/min (ref >60)
Glucose, Bld: 103 mg/dL — ABNORMAL HIGH (ref 70–99)
Potassium: 3.3 mmol/L — ABNORMAL LOW (ref 3.5–5.1)
Sodium: 134 mmol/L — ABNORMAL LOW (ref 135–145)
Total Bilirubin: 0.6 mg/dL (ref 0.0–1.2)
Total Protein: 6.2 g/dL — ABNORMAL LOW (ref 6.5–8.1)

## 2023-07-07 LAB — D-DIMER, QUANTITATIVE: D-Dimer, Quant: 0.76 ug{FEU}/mL — ABNORMAL HIGH (ref 0.00–0.50)

## 2023-07-07 MED ORDER — PANTOPRAZOLE SODIUM 40 MG IV SOLR: 40.0000 mg | Freq: Once | INTRAVENOUS | Status: DC

## 2023-07-07 NOTE — MAU Provider Note (Signed)
 MAU Provider Note  Chief Complaint: Hemoptysis  SUBJECTIVE HPI: Alison Turner is a 33 y.o. [name] at [redacted]w[redacted]d by early ultrasound who presents to maternity admissions reporting hemoptysis. Pregnancy c/b anemia, recurrent kidney stones. Receives [facility] with [facility].  Patient notes coughing up a dime-sized blood clot this evening. Has not occurred again. Noted reflux has been bothering her especially at the end of pregnancy. She otherwise feels well. Denies SOB, BP, extremity swelling, tachycardia, congestion, cough. Mild sore throat. No sick contacts. +FM. Denies VB, LOF, regular contractions, urinary symptoms.   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   scarring removed via hysteroscopy   Ectopic pregnancy    received MTX   Headache    Hypothyroidism    has resolved   Infection    Kidney calculi    Kidney stones    Ovarian cyst    Traumatic injury during pregnancy in third trimester 06/19/2015   Past Surgical History:  Procedure Laterality Date   MOUTH SURGERY     Social History   Socioeconomic History   Marital status: Married    Spouse name: Alailah Safley   Number of children: 2   Years of education: Some college   Highest education level: Not on file  Occupational History   Occupation: Stay at home mom  Tobacco Use   Smoking status: Never   Smokeless tobacco: Never   Tobacco comments:    tried a few puffs, never inhaled  Vaping Use   Vaping status: Never Used  Substance and Sexual Activity   Alcohol use: Not Currently    Comment: Occasionally   Drug use: Never   Sexual activity: Not Currently    Partners: Male    Comment: Married  Other Topics Concern   Not on file  Social History Narrative   Lives at home w/ her husband and son   Left-hand   Caffeine: 1 cup every other day   Social Drivers of Corporate Investment Banker Strain: Not on file  Food Insecurity: Not on file  Transportation Needs: Not  on file  Physical Activity: Not on file  Stress: Not on file  Social Connections: Unknown (11/06/2021)   Received from [facility]   Social Network    Social Network: Not on file  Intimate Partner Violence: Unknown (10/05/2021)   Received from [facility]   HITS    Physically Hurt: Not on file    Insult or Talk Down To: Not on file    Threaten Physical Harm: Not on file    Scream or Curse: Not on file   No current facility-administered medications on file prior to encounter.   Current Outpatient Medications on File Prior to Encounter  Medication Sig Dispense Refill   calcium carbonate (OS-CAL) 1250 (500 Ca) MG chewable tablet Chew 1 tablet by mouth daily.     Prenatal Vit-Fe Fumarate-FA (PRENATAL MULTIVITAMIN) TABS tablet Take 1 tablet by mouth daily at 12 noon.     acetaminophen  (TYLENOL ) 500 MG tablet Take 2 tablets (1,000 mg total) by mouth every 6 (six) hours. 30 tablet 0   cetirizine (ZYRTEC) 5 MG tablet Take 5 mg by mouth daily.     ferrous sulfate  300 (60 Fe) MG/5ML syrup Take 5 mLs (300 mg total) by mouth daily. 150 mL 3   ibuprofen  (ADVIL ) 600 MG tablet Take 1 tablet (600 mg total) by mouth every 6 (six) hours.  30 tablet 0   Allergies  Allergen Reactions   Lidocaine  Itching, Nausea Only and Other (See Comments)        Amoxicillin Rash    ROS:  Pertinent positives/negatives listed above.  I have reviewed patient's Past Medical Hx, Surgical Hx, Family Hx, Social Hx, medications and allergies.   Physical Exam  Patient Vitals for the past 24 hrs:  BP Temp Pulse Resp SpO2 Height Weight  07/07/23 2355 108/70 -- 100 16 99 % -- --  07/07/23 2350 -- -- -- -- 99 % -- --  07/07/23 2345 -- -- -- -- 99 % -- --  07/07/23 2340 -- -- -- -- 99 % -- --  07/07/23 2335 -- -- -- -- 99 % -- --  07/07/23 2330 -- -- -- -- 100 % -- --  07/07/23 2325 -- -- -- -- 99 % -- --  07/07/23 2320 -- -- -- -- 99 % -- --  07/07/23 2315 -- -- -- -- 96 % -- --   07/07/23 2310 -- -- -- -- 97 % -- --  07/07/23 2305 -- -- -- -- 98 % -- --  07/07/23 2300 -- -- -- -- 98 % -- --  07/07/23 2255 -- -- -- -- 98 % -- --  07/07/23 2250 -- -- -- -- 98 % -- --  07/07/23 2245 -- -- -- -- 98 % -- --  07/07/23 2240 -- -- -- -- 98 % -- --  07/07/23 2235 -- -- -- -- 98 % -- --  07/07/23 2230 -- -- -- -- 98 % -- --  07/07/23 2225 -- -- -- -- 98 % -- --  07/07/23 2220 -- -- -- -- 98 % -- --  07/07/23 2158 100/72 -- -- -- -- -- --  07/07/23 2155 -- 97.7 F (36.5 C) 91 17 100 % 5' 3 (1.6 m) 72.6 kg   Constitutional: Well-developed, well-nourished female in no acute distress  Cardiovascular: normal rate, no m/r/g Respiratory: normal effort, CTAB GI: Abd soft, non-tender, Gravid MS: Extremities nontender, no edema, normal ROM, Hoffman's negative b/l Neurologic: Alert and oriented x 4   FHT:  Baseline 125 , moderate variability, accelerations present, no decelerations Contractions: rare  LAB RESULTS Results for orders placed or performed during the hospital encounter of 07/07/23 (from the past 24 hours)  CBC     Status: Abnormal   Collection Time: 07/07/23 10:55 PM  Result Value Ref Range   WBC 7.4 4.0 - 10.5 K/uL   RBC 3.21 (L) 3.87 - 5.11 MIL/uL   Hemoglobin 10.1 (L) 12.0 - 15.0 g/dL   HCT 69.9 (L) 63.9 - 53.9 %   MCV 93.5 80.0 - 100.0 fL   MCH 31.5 26.0 - 34.0 pg   MCHC 33.7 30.0 - 36.0 g/dL   RDW 84.0 (H) 88.4 - 84.4 %   Platelets 173 150 - 400 K/uL   nRBC 0.0 0.0 - 0.2 %  Comprehensive metabolic panel     Status: Abnormal   Collection Time: 07/07/23 10:55 PM  Result Value Ref Range   Sodium 134 (L) 135 - 145 mmol/L   Potassium 3.3 (L) 3.5 - 5.1 mmol/L   Chloride 105 98 - 111 mmol/L   CO2 20 (L) 22 - 32 mmol/L   Glucose, Bld 103 (H) 70 - 99 mg/dL   BUN 5 (L) 6 - 20 mg/dL   Creatinine, Ser 9.31 0.44 - 1.00 mg/dL   Calcium 8.6 (L) 8.9 - 10.3 mg/dL   Total  Protein 6.2 (L) 6.5 - 8.1 g/dL   Albumin 2.8 (L) 3.5 - 5.0 g/dL   AST 26 15 - 41 U/L    ALT 16 0 - 44 U/L   Alkaline Phosphatase 125 38 - 126 U/L   Total Bilirubin 0.6 0.0 - 1.2 mg/dL   GFR, Estimated >39 >39 mL/min   Anion gap 9 5 - 15  D-dimer, quantitative     Status: Abnormal   Collection Time: 07/07/23 10:55 PM  Result Value Ref Range   D-Dimer, Quant 0.76 (H) 0.00 - 0.50 ug/mL-FEU  Group A Strep by PCR     Status: None   Collection Time: 07/07/23 10:56 PM   Specimen: Anterior Nasal Swab; Sterile Swab  Result Value Ref Range   Group A Strep by PCR NOT DETECTED NOT DETECTED       IMAGING No results found.  MAU Management/MDM: Orders Placed This Encounter  Procedures   Resp panel by RT-PCR (RSV, Flu A&B, Covid) Anterior Nasal Swab   Group A Strep by PCR   CBC   Comprehensive metabolic panel   D-dimer, quantitative   Discharge patient    Meds ordered this encounter  Medications   pantoprazole  (PROTONIX ) injection 40 mg     Available prenatal records reviewed.  Patient presents with hemoptysis during third trimester. She feels well and denies CP, SOB, tachycardia on multiple occasions throughout MAU stay. Vital signs are reassuringly normal and physical exam is normal. She does note that reflux has been bothering her, especially here at the end of pregnancy. Will obtain CBC, CMP, d-dimer, flu/covid, strep swabs. Declines PPI/treatment for reflux at this time.  Patient does request to avoid CT scan/radiation as she has already had 3 scans this pregnancy from kidney stones. Extensive discussion had with patient about signs/symptoms, risks of PE. Discussed obtaining d-dimer to risk stratify as her physical exam is normal, vital signs are normal, and she would like to avoid CT. She is amenable to this.  2349: Reassessed patient at the bedside. Continues to feel well with normal vital signs. Discussed that her d-dimer value is normal when adjusted for third trimester of pregnancy which makes risk for PE very low. Discussed that she probably had some irritation for  reflux which caused irritation in her esophagus that led to slight bleeding. She feels reassured by this and does not want a CTA. Discussed close return precautions with her and her husband (CP, SOB, tachycardia, fever) who both expressed verbal understanding and will return to MAU if these symptoms occur.  FWB: Reactive tracing. +FM.  ASSESSMENT 1. Hemoptysis   2. Gastroesophageal reflux disease, unspecified whether esophagitis present   3. NST (non-stress test) reactive   4. [redacted] weeks gestation of pregnancy     PLAN Discharge home with strict return precautions. Allergies as of 07/08/2023       Reactions   Lidocaine  Itching, Nausea Only, Other (See Comments)      Amoxicillin Rash        Medication List     STOP taking these medications    ibuprofen  600 MG tablet Commonly known as: ADVIL        TAKE these medications    acetaminophen  500 MG tablet Commonly known as: TYLENOL  Take 2 tablets (1,000 mg total) by mouth every 6 (six) hours.   calcium carbonate 1250 (500 Ca) MG chewable tablet Commonly known as: OS-CAL Chew 1 tablet by mouth daily.   cetirizine 5 MG tablet Commonly known as: ZYRTEC Take 5 mg  by mouth daily.   ferrous sulfate  300 (60 Fe) MG/5ML syrup Take 5 mLs (300 mg total) by mouth daily.   prenatal multivitamin Tabs tablet Take 1 tablet by mouth daily at 12 noon.         Almarie Moats, MD OB Fellow 07/08/2023  12:07 AM

## 2023-07-07 NOTE — MAU Note (Addendum)
.  Alison Turner is a 33 y.o. at [redacted]w[redacted]d here in MAU reporting coughing up small, clots of chunky bright red blood about 1930. She feels fine and no other symptoms. She states her throat feels drier but not sore. No other complaints. Reports good FM, denies LOF and has had some vag spotting off and on past few days but feels that is normal at this gestation. WEnt to [facility] earlier tonight with symptoms and was sent here.   Onset of complaint: 1930 Pain score: 0 Vitals:   07/07/23 2155 07/07/23 2158  BP:  100/72  Pulse: 91   Resp: 17   Temp: 97.7 F (36.5 C)   SpO2: 100%      FHT:132 Lab orders placed from triage: none

## 2023-07-08 LAB — RESP PANEL BY RT-PCR (RSV, FLU A&B, COVID)  RVPGX2
Influenza A by PCR: NEGATIVE
Influenza B by PCR: NEGATIVE
Resp Syncytial Virus by PCR: NEGATIVE
SARS Coronavirus 2 by RT PCR: NEGATIVE

## 2023-07-08 LAB — GROUP A STREP BY PCR: Group A Strep by PCR: NOT DETECTED

## 2023-07-12 ENCOUNTER — Encounter (HOSPITAL_COMMUNITY): Payer: Self-pay | Admitting: Obstetrics and Gynecology

## 2023-07-12 ENCOUNTER — Other Ambulatory Visit: Payer: Self-pay

## 2023-07-12 ENCOUNTER — Inpatient Hospital Stay (HOSPITAL_COMMUNITY)
Admission: AD | Admit: 2023-07-12 | Discharge: 2023-07-14 | DRG: 807 | Disposition: A | Discharge status: Home / Self Care | Payer: BC Managed Care – PPO | Attending: Obstetrics and Gynecology | Admitting: Obstetrics and Gynecology

## 2023-07-12 DIAGNOSIS — Z833 Family history of diabetes mellitus: Secondary | ICD-10-CM

## 2023-07-12 DIAGNOSIS — O99013 Anemia complicating pregnancy, third trimester: Principal | ICD-10-CM

## 2023-07-12 DIAGNOSIS — Z8249 Family history of ischemic heart disease and other diseases of the circulatory system: Secondary | ICD-10-CM

## 2023-07-12 DIAGNOSIS — Z3A39 39 weeks gestation of pregnancy: Secondary | ICD-10-CM | POA: Diagnosis not present

## 2023-07-12 DIAGNOSIS — O26893 Other specified pregnancy related conditions, third trimester: Secondary | ICD-10-CM | POA: Diagnosis present

## 2023-07-12 LAB — CBC
HCT: 36 % (ref 36.0–46.0)
Hemoglobin: 12.1 g/dL (ref 12.0–15.0)
MCH: 31.6 pg (ref 26.0–34.0)
MCHC: 33.6 g/dL (ref 30.0–36.0)
MCV: 94 fL (ref 80.0–100.0)
Platelets: 179 10*3/uL (ref 150–400)
RBC: 3.83 MIL/uL — ABNORMAL LOW (ref 3.87–5.11)
RDW: 16.5 % — ABNORMAL HIGH (ref 11.5–15.5)
WBC: 8.8 10*3/uL (ref 4.0–10.5)
nRBC: 0 % (ref 0.0–0.2)

## 2023-07-12 LAB — TYPE AND SCREEN
ABO/RH(D): A POS
Antibody Screen: NEGATIVE

## 2023-07-12 MED ORDER — LACTATED RINGERS IV SOLN
500.0000 mL | INTRAVENOUS | Status: DC | PRN
Start: 2023-07-12 — End: 2023-07-13

## 2023-07-12 MED ORDER — SODIUM CHLORIDE 0.9 % IV SOLN: 250.0000 mL | INTRAVENOUS | Status: DC | PRN

## 2023-07-12 MED ORDER — SOD CITRATE-CITRIC ACID 500-334 MG/5ML PO SOLN
30.0000 mL | ORAL | Status: DC | PRN
Start: 2023-07-12 — End: 2023-07-13

## 2023-07-12 MED ORDER — DIPHENHYDRAMINE HCL 25 MG PO CAPS: 25.0000 mg | ORAL_CAPSULE | ORAL | Status: DC

## 2023-07-12 MED ORDER — SODIUM CHLORIDE 0.9% FLUSH
3.0000 mL | Freq: Two times a day (BID) | INTRAVENOUS | Status: DC
Start: 2023-07-12 — End: 2023-07-13

## 2023-07-12 MED ORDER — OXYTOCIN BOLUS FROM INFUSION
333.0000 mL | Freq: Once | INTRAVENOUS | Status: AC
  Administered 2023-07-13: 333 mL via INTRAVENOUS

## 2023-07-12 MED ORDER — LIDOCAINE HCL (PF) 1 % IJ SOLN
30.0000 mL | INTRAMUSCULAR | Status: DC | PRN
Start: 2023-07-12 — End: 2023-07-13

## 2023-07-12 MED ORDER — OXYTOCIN-SODIUM CHLORIDE 30-0.9 UT/500ML-% IV SOLN
2.5000 [IU]/h | INTRAVENOUS | Status: DC
  Filled 2023-07-12: qty 500

## 2023-07-12 MED ORDER — ACETAMINOPHEN 325 MG PO TABS: 650.0000 mg | ORAL_TABLET | ORAL | Status: DC | PRN

## 2023-07-12 MED ORDER — HYDROXYZINE HCL 50 MG PO TABS: 50.0000 mg | ORAL_TABLET | Freq: Four times a day (QID) | ORAL | Status: DC | PRN

## 2023-07-12 MED ORDER — OXYTOCIN 10 UNIT/ML IJ SOLN: 10.0000 [IU] | Freq: Once | INTRAMUSCULAR | Status: DC

## 2023-07-12 MED ORDER — SODIUM CHLORIDE 0.9% FLUSH: 3.0000 mL | INTRAVENOUS | Status: DC | PRN

## 2023-07-12 MED ORDER — LACTATED RINGERS IV SOLN
INTRAVENOUS | Status: DC
Start: 2023-07-12 — End: 2023-07-13

## 2023-07-12 MED ORDER — ONDANSETRON HCL 4 MG/2ML IJ SOLN: 4.0000 mg | Freq: Four times a day (QID) | INTRAMUSCULAR | Status: DC | PRN

## 2023-07-12 MED ORDER — ACETAMINOPHEN 500 MG PO TABS
1000.0000 mg | ORAL_TABLET | ORAL | Status: DC
Start: 2023-07-12 — End: 2023-07-13

## 2023-07-12 NOTE — H&P (Addendum)
 OB ADMISSION/ HISTORY & PHYSICAL:  Admission Date: 07/12/2023  5:28 PM  Admit Diagnosis: CTX, Bleeding    Alison Turner is a 33 y.o. female presenting for contractions and bloody show today. Denies LOF, contractions more intense last couple hours. Had membrane sweep yesterday in office 5/70/-2. Hx precipitous birth. Expecting a baby boy, no name yet. Lynwood present and supportive. Desires WB, class completed.  Prenatal History: [name]   EDC : 07/16/2023, by Other B[facility]  Prenatal care at [facility] since 11 wks. Primary Sigmon, CNM   Prenatal course complicated by: Hx precipitous birth and PPH, plan PIV and TXA at delivery. Hx RPL, progesterone  support in 1T. SAB x 4, ectopic x 1, this pregnancy was spontaneous with timed IC.  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   scarring removed via hysteroscopy   Ectopic pregnancy    received MTX   Headache    Hypothyroidism    has resolved   Infection    Kidney calculi    Kidney stones    Ovarian cyst    Traumatic injury during pregnancy in third trimester 06/19/2015     Past surgical history:  Past Surgical History:  Procedure Laterality Date   MOUTH SURGERY       Family History:  Family History  Problem Relation Age of Onset   Hypertension Mother    Hyperthyroidism Mother    Kidney disease Father    Hyperlipidemia Father    Cancer Maternal Grandmother    Diabetes Maternal Grandmother    Rheum arthritis Maternal  Grandmother    Kidney disease Maternal Grandfather    Heart disease Maternal Grandfather    Stroke Maternal Grandfather    Rheum arthritis Maternal Grandfather    Hypertension Paternal Grandmother    Heart disease Paternal Grandmother    Stroke Paternal Grandmother    Rheum arthritis Paternal Grandmother    Kidney disease Paternal Grandfather    Heart disease Paternal Grandfather    Hypertension Paternal Grandfather    Rheum arthritis Paternal Actor    Healthy Sister    Healthy Brother    Healthy Sister    Healthy Sister    Healthy Sister    Healthy Brother    Healthy Brother      Social History:  reports that she has never smoked. She has never used smokeless tobacco. She reports that she does not currently use alcohol. She reports that she does not use drugs.  Allergies: Lidocaine  and Amoxicillin   Current Medications at time of admission:  Medications Prior to Admission  Medication Sig Dispense Refill Last Dose/Taking   calcium carbonate (OS-CAL) 1250 (500 Ca) MG chewable tablet Chew 1 tablet by mouth daily.      cetirizine (ZYRTEC) 5 MG tablet Take 5 mg by mouth daily.      ferrous sulfate  300 (60 Fe) MG/5ML syrup Take 5 mLs (300 mg total) by mouth daily.  150 mL 3    Prenatal Vit-Fe Fumarate-FA (PRENATAL MULTIVITAMIN) TABS tablet Take 1 tablet by mouth daily at 12 noon.        Review of Systems: ROS As noted above Physical Exam: Vital signs and nursing notes reviewed.  Patient Vitals for the past 24 hrs:  BP Temp Temp src Pulse Resp SpO2 Height Weight  07/12/23 1756 114/78 97.8 F (36.6 C) Oral 90 16 100 % 5' 3 (1.6 m) 73.2 kg     General: AAO x 3, NAD, coping well Heart: RRR Lungs:CTAB Abdomen: Gravid, NT, Leopold's vertex Extremities: no edema Genitalia / VE: Dilation: 7 Effacement (%): 90 Station: -1 Presentation: Vertex Exam by:: D Shalunda Lindh  Vertex, BBOW, + show FHR: 130 BPM, mod variability, + accels, no decels TOCO: Ctx q 3-5 min  Labs:    Pending T&S, CBC, RPR  No results for input(s): WBC, HGB, HCT, PLT in the last 72 hours.   Assessment/Plan:  33 y.o. [name] at [redacted]w[redacted]d  Fetal wellbeing - FHT category 1 EFW 7.5 lbs, AGA Pelvis proven 6'13 IDA, s/p IV iron  x 2 doses Hx precipitous labor Hx PPH, plan AMTSL and TXA prophylactic Labor: Admit to L&D, routine orders Expectant management, AROM prior to water immersion   GBS neg Rubella imm Rh pos  Pain control: desires unmedicated waterbirth Analgesia/anesthesia PRN  Anticipated MOD: NSVB  Plans to breastfeed, plans outpatient circumcision. POC discussed with patient and support team, all questions answered.  Dr Linnell notified of admission / plan of care   Ismael JAYSON Mt CNM, MSN 07/12/2023, 6:08 PM

## 2023-07-12 NOTE — MAU Note (Signed)
 Alison Turner is a 33 y.o. at [redacted]w[redacted]d here in MAU reporting: contractions aren't like super consistent, but having bleeding with contractions.Looks like a period. No leaking of fluid.  Reports +FM Was 5cm yesterday.  Onset of complaint: bleeding today. Pain score: moderate Vitals:   07/12/23 1756  BP: 114/78  Pulse: 90  Resp: 16  Temp: 97.8 F (36.6 C)  SpO2: 100%     FHT:120 Lab orders placed from triage:     Vtx confrimed by provider

## 2023-07-13 ENCOUNTER — Encounter (HOSPITAL_COMMUNITY): Payer: Self-pay | Admitting: Obstetrics and Gynecology

## 2023-07-13 LAB — CBC
HCT: 29.2 % — ABNORMAL LOW (ref 36.0–46.0)
Hemoglobin: 10.2 g/dL — ABNORMAL LOW (ref 12.0–15.0)
MCH: 32.2 pg (ref 26.0–34.0)
MCHC: 34.9 g/dL (ref 30.0–36.0)
MCV: 92.1 fL (ref 80.0–100.0)
Platelets: 145 10*3/uL — ABNORMAL LOW (ref 150–400)
RBC: 3.17 MIL/uL — ABNORMAL LOW (ref 3.87–5.11)
RDW: 16 % — ABNORMAL HIGH (ref 11.5–15.5)
WBC: 11.7 10*3/uL — ABNORMAL HIGH (ref 4.0–10.5)
nRBC: 0 % (ref 0.0–0.2)

## 2023-07-13 LAB — RPR: RPR Ser Ql: NONREACTIVE

## 2023-07-13 MED ORDER — IBUPROFEN 800 MG PO TABS
800.0000 mg | ORAL_TABLET | Freq: Once | ORAL | Status: AC
  Administered 2023-07-13: 800 mg via ORAL
  Filled 2023-07-13: qty 1

## 2023-07-13 MED ORDER — TETANUS-DIPHTH-ACELL PERTUSSIS 5-2.5-18.5 LF-MCG/0.5 IM SUSY: 0.5000 mL | PREFILLED_SYRINGE | Freq: Once | INTRAMUSCULAR | Status: DC

## 2023-07-13 MED ORDER — BENZOCAINE-MENTHOL 20-0.5 % EX AERO: 1.0000 | INHALATION_SPRAY | CUTANEOUS | Status: DC | PRN

## 2023-07-13 MED ORDER — ONDANSETRON HCL 4 MG/2ML IJ SOLN: 4.0000 mg | INTRAMUSCULAR | Status: DC | PRN

## 2023-07-13 MED ORDER — WITCH HAZEL-GLYCERIN EX PADS: 1.0000 | MEDICATED_PAD | CUTANEOUS | Status: DC | PRN

## 2023-07-13 MED ORDER — DIBUCAINE (PERIANAL) 1 % EX OINT: 1.0000 | TOPICAL_OINTMENT | CUTANEOUS | Status: DC | PRN

## 2023-07-13 MED ORDER — PRENATAL MULTIVITAMIN CH
1.0000 | ORAL_TABLET | Freq: Every day | ORAL | Status: DC
  Filled 2023-07-13: qty 1

## 2023-07-13 MED ORDER — ACETAMINOPHEN 500 MG PO TABS
1000.0000 mg | ORAL_TABLET | Freq: Four times a day (QID) | ORAL | Status: DC
  Administered 2023-07-13: 1000 mg via ORAL
  Filled 2023-07-13 (×2): qty 2

## 2023-07-13 MED ORDER — SENNOSIDES-DOCUSATE SODIUM 8.6-50 MG PO TABS
2.0000 | ORAL_TABLET | ORAL | Status: DC
  Filled 2023-07-13: qty 2

## 2023-07-13 MED ORDER — TRANEXAMIC ACID-NACL 1000-0.7 MG/100ML-% IV SOLN
INTRAVENOUS | Status: AC
  Administered 2023-07-13: 1000 mg
  Filled 2023-07-13: qty 100

## 2023-07-13 MED ORDER — SIMETHICONE 80 MG PO CHEW: 80.0000 mg | CHEWABLE_TABLET | ORAL | Status: DC | PRN

## 2023-07-13 MED ORDER — ZOLPIDEM TARTRATE 5 MG PO TABS: 5.0000 mg | ORAL_TABLET | Freq: Every evening | ORAL | Status: DC | PRN

## 2023-07-13 MED ORDER — IBUPROFEN 600 MG PO TABS
600.0000 mg | ORAL_TABLET | Freq: Four times a day (QID) | ORAL | Status: DC
  Administered 2023-07-13 – 2023-07-14 (×3): 600 mg via ORAL
  Filled 2023-07-13 (×5): qty 1

## 2023-07-13 MED ORDER — COCONUT OIL OIL: 1.0000 | TOPICAL_OIL | Status: DC | PRN

## 2023-07-13 MED ORDER — FLEET ENEMA RE ENEM: 1.0000 | ENEMA | Freq: Every day | RECTAL | Status: DC | PRN

## 2023-07-13 MED ORDER — ONDANSETRON HCL 4 MG PO TABS: 4.0000 mg | ORAL_TABLET | ORAL | Status: DC | PRN

## 2023-07-13 MED ORDER — BISACODYL 10 MG RE SUPP: 10.0000 mg | Freq: Every day | RECTAL | Status: DC | PRN

## 2023-07-13 MED ORDER — DIPHENHYDRAMINE HCL 25 MG PO CAPS: 25.0000 mg | ORAL_CAPSULE | Freq: Four times a day (QID) | ORAL | Status: DC | PRN

## 2023-07-13 NOTE — Progress Notes (Signed)
Patient declined baby scripts 

## 2023-07-13 NOTE — Lactation Note (Signed)
 This note was copied from a baby's chart. Lactation Consultation Note  Patient Name: Alison Turner Unijb'd Date: 07/13/2023 Age:33 hours  Reason for consult: Initial assessment;Term  P4, [redacted]w[redacted]d  Initial LC visit to see P4 mother and her term infant. Mother was resting upon arrival to room and receptive to visit. She reports she has experience with breastfeeding her other 3 children for 2.5 years and her last child recently weaned herself. Mother states this baby has breast fed well so far. She requested to have the DEBP set up for additional pumping/ stimulation to the right breast. Instructed mother on the use, frequency, cleaning of breast pump and storage of breast milk. Mother appears to need a 18 mm flange for the right breast and 21 mm flange for the left. The pump and flange size was instructed but mother did not demonstrate pumping. Instructed to use the flange that feels best and extends nipple comfortably. Mother continues to rest. She was shown how to start pump and pump in the initiation setting. Mother desires to pump later.   Mother will request LC or nursing if she has additional needs regarding breastfeeding or pumping.   Maternal Data Has patient been taught Hand Expression?: No Does the patient have breastfeeding experience prior to this delivery?: Yes How long did the patient breastfeed?: She breast fed her other 3 children for 2.5 years each. Last child is 2.5yo and recently weaned herself  Feeding Mother's Current Feeding Choice: Breast Milk   Lactation Tools Discussed/Used Tools: Pump;Flanges Flange Size: 18;21 (right nipple was fitted with 18 mm) Breast pump type: Double-Electric Breast Pump Pump Education: Setup, frequency, and cleaning;Milk Storage Reason for Pumping: mother requested Pumping frequency: mother to use prn  Interventions Interventions: DEBP;Education;LC Services brochure  Discharge Pump: DEBP;Personal;Manual (Alison Turner)  Consult  Status Consult Status: Complete    Joshua Rojelio HERO 07/13/2023, 3:38 PM

## 2023-07-14 ENCOUNTER — Encounter (HOSPITAL_COMMUNITY): Payer: Self-pay | Admitting: Obstetrics and Gynecology

## 2023-07-14 MED ORDER — IBUPROFEN 600 MG PO TABS: 600.0000 mg | ORAL_TABLET | Freq: Four times a day (QID) | ORAL | Status: AC

## 2023-07-14 MED ORDER — ACETAMINOPHEN 500 MG PO TABS: 1000.0000 mg | ORAL_TABLET | Freq: Four times a day (QID) | ORAL | Status: AC

## 2023-07-14 NOTE — Discharge Instructions (Signed)
 Congratulations! We hope you have a wonderful postpartum period. We will plan to see you in the office in 6 weeks. Please call the office if you experience fevers (>100.4 F), heavy vaginal bleeding (using >2 pads/hour), severe pain not responsive to ibuprofen (Advil or Motrin) and acetaminophen (Tylenol), chest pain, shortness of breath, or if you have pain, redness, or swelling in one or both legs. Mood swings, fatigue, and feeling down can be normal in the first two weeks following birth. If you feel your mood symptoms are severe or lasting longer than two weeks, please call our office. If you have any thoughts of hurting yourself or others please call our office. Please call your pediatrician if you have any concerns about your baby.

## 2023-07-14 NOTE — Discharge Summary (Signed)
 Postpartum Discharge Summary    Patient Name: Alison Turner DOB: 14-Nov-1990 MRN: [id number]  Date of admission: 07/12/2023 Delivery date:07/13/2023 Delivering provider: DEWARD ISMAEL BROCKS Date of discharge: 07/14/2023  Admitting diagnosis: Normal labor [O80, Z37.9] Intrauterine pregnancy: [redacted]w[redacted]d     Secondary diagnosis:  Principal Problem:   Postpartum care following vaginal delivery 1/8 Active Problems:   SVD / Waterbirth   Normal labor  Additional problems: None    Discharge diagnosis: Term Pregnancy Delivered                                              Post partum procedures: None Augmentation: N/A Complications: None  Hospital course: Onset of Labor With Vaginal Delivery      33 y.o. yo H1E6855 at [redacted]w[redacted]d was admitted in labor on 07/12/2023.me/Date: 2:40 AM,07/13/2023  Delivery Method:Vaginal, Spontaneous Operative Delivery:N/A Episiotomy: None Lacerations:  None Patient had a uncomplicated postpartum course.  She is ambulating, tolerating a regular diet, passing flatus, and urinating well. Patient is discharged home in stable condition on 07/14/23.  Newborn Data: Birth date:07/13/2023 Birth time:3:27 AM Gender:Female Living status:Living Apgars:7 ,9  Weight:3771 g  Magnesium  Sulfate received: No BMZ received: No Rhophylac:No MMR:N/A Immunizations administered: There is no immunization history for the selected administration types on file for this patient.  Physical exam  Vitals:   07/13/23 1050 07/13/23 1500 07/13/23 1917 07/14/23 0506  BP: 99/68 108/72 102/71 115/84  Pulse: 84 88 74 65  Resp: 18 18 16 16   Temp:  97.7 F (36.5 C) (!) 97.5 F (36.4 C) (!) 97.3 F (36.3 C)  TempSrc:  Oral Oral Oral  SpO2: 100% 98% 99% 99%  Weight:      Height:       General: alert, cooperative, and no distress Lochia: appropriate Uterine Fundus: firm Incision: N/A DVT Evaluation: No evidence of DVT seen on physical exam. Labs: Lab Results  Component Value Date   WBC 11.7  (H) 07/13/2023   HGB 10.2 (L) 07/13/2023   HCT 29.2 (L) 07/13/2023   MCV 92.1 07/13/2023   PLT 145 (L) 07/13/2023      Latest Ref Rng & Units 07/07/2023   10:55 PM  CMP  Glucose 70 - 99 mg/dL 896   BUN 6 - 20 mg/dL 5   Creatinine 9.55 - 8.99 mg/dL 9.31   Sodium 864 - 854 mmol/L 134   Potassium 3.5 - 5.1 mmol/L 3.3   Chloride 98 - 111 mmol/L 105   CO2 22 - 32 mmol/L 20   Calcium 8.9 - 10.3 mg/dL 8.6   Total Protein 6.5 - 8.1 g/dL 6.2   Total Bilirubin 0.0 - 1.2 mg/dL 0.6   Alkaline Phos 38 - 126 U/L 125   AST 15 - 41 U/L 26   ALT 0 - 44 U/L 16    Edinburgh Score:    07/13/2023    6:39 PM  Edinburgh Postnatal Depression Scale Screening Tool  I have been able to laugh and see the funny side of things. 0  I have looked forward with enjoyment to things. 0  I have blamed myself unnecessarily when things went wrong. 0  I have been anxious or worried for no good reason. 0  I have felt scared or panicky for no good reason. 0  Things have been getting on top of me. 0  I have been so unhappy  that I have had difficulty sleeping. 0  I have felt sad or miserable. 0  I have been so unhappy that I have been crying. 0  The thought of harming myself has occurred to me. 0  Edinburgh Postnatal Depression Scale Total 0      After visit meds:  Allergies as of 07/14/2023       Reactions   Iron  Other (See Comments)   Ferreheme.SABRASABRAPassed out... lost bladder and bowel control   Lidocaine  Itching, Nausea Only, Other (See Comments)      Amoxicillin Rash        Medication List     TAKE these medications    acetaminophen  500 MG tablet Commonly known as: TYLENOL  Take 2 tablets (1,000 mg total) by mouth every 6 (six) hours.   calcium carbonate 1250 (500 Ca) MG chewable tablet Commonly known as: OS-CAL Chew 1 tablet by mouth daily.   cetirizine 5 MG tablet Commonly known as: ZYRTEC Take 5 mg by mouth daily.   ferrous sulfate  300 (60 Fe) MG/5ML syrup Take 5 mLs (300 mg total) by  mouth daily.   ibuprofen  600 MG tablet Commonly known as: ADVIL  Take 1 tablet (600 mg total) by mouth every 6 (six) hours.   prenatal multivitamin Tabs tablet Take 1 tablet by mouth daily at 12 noon.         Discharge home in stable condition Infant Feeding: Breast Infant Disposition:home with mother Discharge instruction: per After Visit Summary and Postpartum booklet. Activity: Advance as tolerated. Pelvic rest for 6 weeks.  Diet: routine diet Anticipated Birth Control: Unsure Postpartum Appointment:6 weeks Additional Postpartum F/U:  None Future Appointments:No future appointments. Follow up Visit:  Follow-up Information     Obgyn, [place]. Schedule an appointment as soon as possible for a visit in 6 week(s).   Contact information: [address] [place] [address] [phone]                     07/14/2023 Hadassah DELENA Springer, MD

## 2023-07-14 NOTE — Lactation Note (Signed)
 This note was copied from a baby's chart. Lactation Consultation Note  Patient Name: Boy Nickolette Espinola Unijb'd Date: 07/14/2023 Age:33 hours Reason for consult: Follow-up assessment;Maternal discharge;Term  P3, 39 wks, @ 32 hrs of life. Mom anticipates discharge today. Highlighted expectations of infant feeding pattern, cluster feeding overnight to bring milk in, and milk coming in over the next days-  Encouraged mom that breast stimulation is key for milk production, Encouraged hand expression and breast compression to keep infants attention at breast. Per mom she is an over-producer. Highlighted expectations of milk coming in, and risk of engorgement. Encouraged hand expression/pump, motrin , and ice packs for 10-20 minutes best treatment for overfull engorged/inflamed breast.   Maternal Data Does the patient have breastfeeding experience prior to this delivery?: Yes How long did the patient breastfeed?: Experienced breast feeder- 2 1/2 years with each child  Feeding Mother's Current Feeding Choice: Breast Milk  Discharge Discharge Education: Engorgement and breast care Pump: Personal  Consult Status Consult Status: Complete    Heinz Muskrat 07/14/2023, 11:31 AM

## 2023-07-25 ENCOUNTER — Telehealth (HOSPITAL_COMMUNITY): Payer: Self-pay | Admitting: *Deleted

## 2023-07-25 NOTE — Telephone Encounter (Signed)
07/25/2023  Name: Jeroldine Prime MRN: [id number] DOB: 1990-12-02  Reason for Call:  Transition of Care Hospital Discharge Call  Contact Status: Patient Contact Status: Complete  Language assistant needed: Interpreter Mode: Interpreter Not Needed        Follow-Up Questions: Do You Have Any Concerns About Your Health As You Heal From Delivery?: No Do You Have Any Concerns About Your Infants Health?: No  Edinburgh Postnatal Depression Scale:  In the Past 7 Days:    PHQ2-9 Depression Scale:     Discharge Follow-up: Edinburgh score requires follow up?:  (declines screening today, says she is the same as in the hosptial (score was 0)) Patient was advised of the following resources:: Support Group, Breastfeeding Support Group (declines postpartum group information via email)  Post-discharge interventions: Reviewed Newborn Safe Sleep Practices  Salena Saner, RN 07/25/2023 10:55
